# Patient Record
Sex: Female | Born: 1965 | Race: Black or African American | Hispanic: No | Marital: Single | Smoking: Never smoker
Health system: Southern US, Community
[De-identification: ages and names within clinical notes are randomized; demographics above are authoritative.]

## PROBLEM LIST (undated history)

## (undated) DIAGNOSIS — I10 Essential (primary) hypertension: Secondary | ICD-10-CM

## (undated) HISTORY — PX: APPENDECTOMY: SHX54

---

## 2005-03-26 ENCOUNTER — Emergency Department: Payer: Self-pay | Admitting: Emergency Medicine

## 2006-06-13 ENCOUNTER — Ambulatory Visit: Payer: Self-pay | Admitting: Family Medicine

## 2007-09-23 ENCOUNTER — Ambulatory Visit: Payer: Self-pay | Admitting: Family Medicine

## 2008-10-27 ENCOUNTER — Ambulatory Visit: Payer: Self-pay | Admitting: Family Medicine

## 2008-11-16 ENCOUNTER — Ambulatory Visit: Payer: Self-pay | Admitting: Family Medicine

## 2013-01-08 ENCOUNTER — Ambulatory Visit: Payer: Self-pay | Admitting: Physician Assistant

## 2014-02-12 ENCOUNTER — Ambulatory Visit: Payer: Self-pay | Admitting: Physician Assistant

## 2015-03-28 ENCOUNTER — Other Ambulatory Visit: Payer: Self-pay | Admitting: Physician Assistant

## 2015-03-28 DIAGNOSIS — Z1231 Encounter for screening mammogram for malignant neoplasm of breast: Secondary | ICD-10-CM

## 2015-04-01 ENCOUNTER — Ambulatory Visit
Admission: RE | Admit: 2015-04-01 | Discharge: 2015-04-01 | Disposition: A | Discharge status: Home / Self Care | Payer: Managed Care, Other (non HMO) | Source: Ambulatory Visit | Attending: Physician Assistant | Admitting: Physician Assistant

## 2015-04-01 DIAGNOSIS — Z1231 Encounter for screening mammogram for malignant neoplasm of breast: Secondary | ICD-10-CM | POA: Diagnosis present

## 2016-03-02 ENCOUNTER — Other Ambulatory Visit: Payer: Self-pay | Admitting: Physician Assistant

## 2016-03-02 DIAGNOSIS — Z1231 Encounter for screening mammogram for malignant neoplasm of breast: Secondary | ICD-10-CM

## 2016-04-27 ENCOUNTER — Encounter (HOSPITAL_COMMUNITY): Payer: Self-pay

## 2016-04-27 ENCOUNTER — Ambulatory Visit
Admission: RE | Admit: 2016-04-27 | Discharge: 2016-04-27 | Disposition: A | Discharge status: Home / Self Care | Payer: Managed Care, Other (non HMO) | Source: Ambulatory Visit | Attending: Physician Assistant | Admitting: Physician Assistant

## 2016-04-27 DIAGNOSIS — Z1231 Encounter for screening mammogram for malignant neoplasm of breast: Secondary | ICD-10-CM

## 2017-06-13 ENCOUNTER — Other Ambulatory Visit: Payer: Self-pay | Admitting: Physician Assistant

## 2017-06-13 DIAGNOSIS — Z1231 Encounter for screening mammogram for malignant neoplasm of breast: Secondary | ICD-10-CM

## 2017-06-17 ENCOUNTER — Ambulatory Visit
Admission: RE | Admit: 2017-06-17 | Discharge: 2017-06-17 | Disposition: A | Discharge status: Home / Self Care | Payer: Managed Care, Other (non HMO) | Source: Ambulatory Visit | Attending: Physician Assistant | Admitting: Physician Assistant

## 2017-06-17 DIAGNOSIS — Z1231 Encounter for screening mammogram for malignant neoplasm of breast: Secondary | ICD-10-CM | POA: Diagnosis present

## 2018-07-07 ENCOUNTER — Encounter: Payer: Self-pay | Admitting: Emergency Medicine

## 2018-07-07 ENCOUNTER — Other Ambulatory Visit: Payer: Self-pay

## 2018-07-07 ENCOUNTER — Emergency Department
Admission: EM | Admit: 2018-07-07 | Discharge: 2018-07-07 | Disposition: A | Discharge status: Home / Self Care | Payer: 59 | Attending: Student in an Organized Health Care Education/Training Program | Admitting: Student in an Organized Health Care Education/Training Program

## 2018-07-07 DIAGNOSIS — I10 Essential (primary) hypertension: Secondary | ICD-10-CM | POA: Diagnosis not present

## 2018-07-07 DIAGNOSIS — R002 Palpitations: Secondary | ICD-10-CM | POA: Diagnosis present

## 2018-07-07 HISTORY — DX: Essential (primary) hypertension: I10

## 2018-07-07 LAB — URINALYSIS, ROUTINE W REFLEX MICROSCOPIC
BACTERIA UA: NONE SEEN
BILIRUBIN URINE: NEGATIVE
GLUCOSE, UA: NEGATIVE mg/dL
Ketones, ur: NEGATIVE mg/dL
Leukocytes,Ua: NEGATIVE
Nitrite: NEGATIVE
PH: 7 (ref 5.0–8.0)
Protein, ur: NEGATIVE mg/dL
SPECIFIC GRAVITY, URINE: 1.002 — AB (ref 1.005–1.030)
WBC UA: NONE SEEN WBC/hpf (ref 0–5)

## 2018-07-07 LAB — CBC
HCT: 45.8 % (ref 36.0–46.0)
Hemoglobin: 14.8 g/dL (ref 12.0–15.0)
MCH: 29.8 pg (ref 26.0–34.0)
MCHC: 32.3 g/dL (ref 30.0–36.0)
MCV: 92.3 fL (ref 80.0–100.0)
Platelets: 389 10*3/uL (ref 150–400)
RBC: 4.96 MIL/uL (ref 3.87–5.11)
RDW: 12.3 % (ref 11.5–15.5)
WBC: 9.9 10*3/uL (ref 4.0–10.5)
nRBC: 0 % (ref 0.0–0.2)

## 2018-07-07 LAB — BASIC METABOLIC PANEL
Anion gap: 9 (ref 5–15)
BUN: 17 mg/dL (ref 6–20)
CHLORIDE: 102 mmol/L (ref 98–111)
CO2: 28 mmol/L (ref 22–32)
CREATININE: 1.01 mg/dL — AB (ref 0.44–1.00)
Calcium: 9.7 mg/dL (ref 8.9–10.3)
GFR calc Af Amer: >60 mL/min (ref >60)
GFR calc non Af Amer: >60 mL/min (ref >60)
Glucose, Bld: 90 mg/dL (ref 70–99)
Potassium: 3.9 mmol/L (ref 3.5–5.1)
Sodium: 139 mmol/L (ref 135–145)

## 2018-07-07 LAB — TROPONIN I
Troponin I: <0.03 ng/mL (ref <0.03)
Troponin I: <0.03 ng/mL (ref <0.03)

## 2018-07-07 MED ORDER — LORAZEPAM 0.5 MG PO TABS: 0.5000 mg | ORAL_TABLET | Freq: Three times a day (TID) | ORAL | 0 refills | Status: AC | PRN

## 2018-07-07 NOTE — ED Provider Notes (Signed)
[facility] Emergency Department Provider Note    First MD Initiated Contact with Patient 07/07/18 1730     (approximate)  I have reviewed the triage vital signs and the nursing notes.   HISTORY  Chief Complaint Palpitations    HPI Jenna Durham is a 53 y.o. female listed past medical history presents the ER for evaluation of palpitations.  States that she is been having them intermittently for the past week or so.  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  Did not feel that it was irregular.  Denies any chest pressure or pain.  Does feel she is been stressed out and under increasing pressure at work.  Denies any measured fevers.  No cough.  No shortness of breath.    Past Medical History:  Diagnosis Date  . Hypertension    Family History  Problem Relation Age of Onset  . Breast cancer Neg Hx    Past Surgical History:  Procedure Laterality Date  . APPENDECTOMY     There are no active problems to display for this patient.     Prior to Admission medications   Medication Sig Start Date End Date Taking? Authorizing Provider  LORazepam (ATIVAN) 0.5 MG tablet Take 1 tablet (0.5 mg total) by mouth every 8 (eight) hours as needed for anxiety or sleep. 07/07/18 07/07/19  Willy Eddy, MD    Allergies Patient has no known allergies.    Social History Social History   Tobacco Use  . Smoking status: Never Smoker  . Smokeless tobacco: Never Used  Substance Use Topics  . Alcohol use: Not Currently  . Drug use: Not on file    Review of Systems Patient denies headaches, rhinorrhea, blurry vision, numbness, shortness of breath, chest pain, edema, cough, abdominal pain, nausea, vomiting, diarrhea, dysuria, fevers, rashes or hallucinations unless otherwise stated above in HPI. ____________________________________________   PHYSICAL  EXAM:  VITAL SIGNS: Vitals:   07/07/18 1513 07/07/18 1519  BP: (!) 152/89   Pulse: 97   Resp: 18   Temp:  98.9 F (37.2 C)  SpO2: 98%     Constitutional: Alert and oriented.  Eyes: Conjunctivae are normal.  Head: Atraumatic. Nose: No congestion/rhinnorhea. Mouth/Throat: Mucous membranes are moist.   Neck: No stridor. Painless ROM.  Cardiovascular: Normal rate, regular rhythm. Grossly normal heart sounds.  Good peripheral circulation. Respiratory: Normal respiratory effort.  No retractions. Lungs CTAB. Gastrointestinal: Soft and nontender. No distention. No abdominal bruits. No CVA tenderness. Genitourinary:  Musculoskeletal: No lower extremity tenderness nor edema.  No joint effusions. Neurologic:  Normal speech and language. No gross focal neurologic deficits are appreciated. No facial droop Skin:  Skin is warm, dry and intact. No rash noted. Psychiatric: Mood and affect are normal. Speech and behavior are normal.  ____________________________________________   LABS (all labs ordered are listed, but only abnormal results are displayed)  Results for orders placed or performed during the hospital encounter of 07/07/18 (from the past 24 hour(s))  Basic metabolic panel     Status: Abnormal   Collection Time: 07/07/18  3:23 PM  Result Value Ref Range   Sodium 139 135 - 145 mmol/L   Potassium 3.9 3.5 - 5.1 mmol/L   Chloride 102 98 - 111 mmol/L   CO2 28 22 - 32 mmol/L   Glucose, Bld 90 70 - 99 mg/dL   BUN 17  6 - 20 mg/dL   Creatinine, Ser 8.76 (H) 0.44 - 1.00 mg/dL   Calcium 9.7 8.9 - 81.1 mg/dL   GFR calc non Af Amer >60 >60 mL/min   GFR calc Af Amer >60 >60 mL/min   Anion gap 9 5 - 15  CBC     Status: None   Collection Time: 07/07/18  3:23 PM  Result Value Ref Range   WBC 9.9 4.0 - 10.5 K/uL   RBC 4.96 3.87 - 5.11 MIL/uL   Hemoglobin 14.8 12.0 - 15.0 g/dL   HCT 57.2 62.0 - 35.5 %   MCV 92.3 80.0 - 100.0 fL   MCH 29.8 26.0 - 34.0 pg   MCHC 32.3 30.0 - 36.0 g/dL    RDW 97.4 16.3 - 84.5 %   Platelets 389 150 - 400 K/uL   nRBC 0.0 0.0 - 0.2 %  Troponin I - ONCE - STAT     Status: None   Collection Time: 07/07/18  3:23 PM  Result Value Ref Range   Troponin I <0.03 <0.03 ng/mL  Urinalysis, Routine w reflex microscopic     Status: Abnormal   Collection Time: 07/07/18  3:23 PM  Result Value Ref Range   Color, Urine COLORLESS (A) YELLOW   APPearance CLEAR (A) CLEAR   Specific Gravity, Urine 1.002 (L) 1.005 - 1.030   pH 7.0 5.0 - 8.0   Glucose, UA NEGATIVE NEGATIVE mg/dL   Hgb urine dipstick SMALL (A) NEGATIVE   Bilirubin Urine NEGATIVE NEGATIVE   Ketones, ur NEGATIVE NEGATIVE mg/dL   Protein, ur NEGATIVE NEGATIVE mg/dL   Nitrite NEGATIVE NEGATIVE   Leukocytes,Ua NEGATIVE NEGATIVE   RBC / HPF 0-5 0 - 5 RBC/hpf   WBC, UA NONE SEEN 0 - 5 WBC/hpf   Bacteria, UA NONE SEEN NONE SEEN   Squamous Epithelial / LPF 0-5 0 - 5  Troponin I - ONCE - STAT     Status: None   Collection Time: 07/07/18  6:20 PM  Result Value Ref Range   Troponin I <0.03 <0.03 ng/mL   ____________________________________________  EKG My review and personal interpretation at Time: 15:21   Indication: palpitations  Rate: 99  Rhythm: sinus Axis: normal Other: normal intervals, no stemi ____________________________________________   ____________________________________________   PROCEDURES  Procedure(s) performed:  Procedures    Critical Care performed: no ____________________________________________   INITIAL IMPRESSION / ASSESSMENT AND PLAN / ED COURSE  Pertinent labs & imaging results that were available during my care of the patient were reviewed by me and considered in my medical decision making (see chart for details).   DDX: Dysrhythmia, anemia, ACS, anxiety, electrolyte abnormality  Jenna Durham is a 53 y.o. who presents to the ED with symptoms as described above.  Patient well-appearing and hemodynamically stable.  She is without any chest pain or  pressure.  Seems to be having more symptomatic palpitations though she is not having any signs of regular heartbeat or dysrhythmia on EKG or monitor.  Blood will be sent for the above differential.  She is low risk heart score.  Will order serial enzymes to further re-stratify.  Do have a high suspicions for some component of an stress induced symptomatology.  Clinical Course as of Jul 07 2032  Mon Jul 07, 2018  2025 Repeat troponin is negative.  At this point do believe patient stable and appropriate for outpatient follow-up.   [PR]    Clinical Course User Index [PR] Willy Eddy, MD  As part of my medical decision making, I reviewed the following data within the electronic MEDICAL RECORD NUMBER Nursing notes reviewed and incorporated, Labs reviewed, notes from prior ED visits and [place] Controlled Substance Database   ____________________________________________   FINAL CLINICAL IMPRESSION(S) / ED DIAGNOSES  Final diagnoses:  Palpitations      NEW MEDICATIONS STARTED DURING THIS VISIT:  New Prescriptions   LORAZEPAM (ATIVAN) 0.5 MG TABLET    Take 1 tablet (0.5 mg total) by mouth every 8 (eight) hours as needed for anxiety or sleep.     Note:  This document was prepared using Dragon voice recognition software and may include unintentional dictation errors.    Willy Eddy, MD 07/07/18 2034

## 2018-07-07 NOTE — ED Triage Notes (Signed)
Says she was at work and kept getting hot and felt her pulse.  She does have to wear a mask and hood at work.  Denies chest pain.

## 2019-02-18 ENCOUNTER — Other Ambulatory Visit: Payer: Self-pay | Admitting: Orthopedic Surgery

## 2019-02-18 DIAGNOSIS — M25561 Pain in right knee: Secondary | ICD-10-CM

## 2019-02-18 DIAGNOSIS — M1812 Unilateral primary osteoarthritis of first carpometacarpal joint, left hand: Secondary | ICD-10-CM

## 2019-02-18 DIAGNOSIS — M1711 Unilateral primary osteoarthritis, right knee: Secondary | ICD-10-CM

## 2019-03-06 ENCOUNTER — Ambulatory Visit
Admission: RE | Admit: 2019-03-06 | Discharge: 2019-03-06 | Disposition: A | Discharge status: Home / Self Care | Payer: 59 | Source: Ambulatory Visit | Attending: Orthopedic Surgery | Admitting: Orthopedic Surgery

## 2019-03-06 ENCOUNTER — Other Ambulatory Visit: Payer: Self-pay

## 2019-03-06 DIAGNOSIS — M1711 Unilateral primary osteoarthritis, right knee: Secondary | ICD-10-CM | POA: Diagnosis present

## 2019-03-06 DIAGNOSIS — M25561 Pain in right knee: Secondary | ICD-10-CM | POA: Insufficient documentation

## 2019-03-06 DIAGNOSIS — M1812 Unilateral primary osteoarthritis of first carpometacarpal joint, left hand: Secondary | ICD-10-CM | POA: Diagnosis present

## 2020-06-02 ENCOUNTER — Other Ambulatory Visit: Payer: Self-pay | Admitting: Nurse Practitioner

## 2020-06-02 DIAGNOSIS — Z1231 Encounter for screening mammogram for malignant neoplasm of breast: Secondary | ICD-10-CM

## 2020-06-23 ENCOUNTER — Other Ambulatory Visit: Payer: Self-pay

## 2020-06-23 ENCOUNTER — Ambulatory Visit
Admission: RE | Admit: 2020-06-23 | Discharge: 2020-06-23 | Disposition: A | Discharge status: Home / Self Care | Payer: 59 | Source: Ambulatory Visit | Attending: Nurse Practitioner | Admitting: Nurse Practitioner

## 2020-06-23 DIAGNOSIS — Z1231 Encounter for screening mammogram for malignant neoplasm of breast: Secondary | ICD-10-CM | POA: Diagnosis not present

## 2020-09-29 ENCOUNTER — Other Ambulatory Visit: Payer: Self-pay | Admitting: Orthopedic Surgery

## 2020-09-29 DIAGNOSIS — M7712 Lateral epicondylitis, left elbow: Secondary | ICD-10-CM

## 2020-09-29 DIAGNOSIS — M25522 Pain in left elbow: Secondary | ICD-10-CM

## 2020-10-12 ENCOUNTER — Ambulatory Visit
Admission: RE | Admit: 2020-10-12 | Discharge: 2020-10-12 | Disposition: A | Discharge status: Home / Self Care | Payer: 59 | Source: Ambulatory Visit | Attending: Orthopedic Surgery | Admitting: Orthopedic Surgery

## 2020-10-12 ENCOUNTER — Other Ambulatory Visit: Payer: Self-pay

## 2020-10-12 DIAGNOSIS — M25522 Pain in left elbow: Secondary | ICD-10-CM | POA: Insufficient documentation

## 2020-10-12 DIAGNOSIS — M7712 Lateral epicondylitis, left elbow: Secondary | ICD-10-CM | POA: Diagnosis present

## 2021-03-10 ENCOUNTER — Other Ambulatory Visit (HOSPITAL_BASED_OUTPATIENT_CLINIC_OR_DEPARTMENT_OTHER): Payer: Self-pay | Admitting: Nurse Practitioner

## 2021-03-10 ENCOUNTER — Other Ambulatory Visit: Payer: Self-pay | Admitting: Nurse Practitioner

## 2021-03-10 DIAGNOSIS — R109 Unspecified abdominal pain: Secondary | ICD-10-CM

## 2021-03-10 DIAGNOSIS — R319 Hematuria, unspecified: Secondary | ICD-10-CM

## 2021-03-20 ENCOUNTER — Other Ambulatory Visit: Payer: Self-pay

## 2021-03-20 ENCOUNTER — Ambulatory Visit
Admission: RE | Admit: 2021-03-20 | Discharge: 2021-03-20 | Disposition: A | Discharge status: Home / Self Care | Payer: 59 | Source: Ambulatory Visit | Attending: Nurse Practitioner | Admitting: Nurse Practitioner

## 2021-03-20 DIAGNOSIS — R109 Unspecified abdominal pain: Secondary | ICD-10-CM | POA: Diagnosis present

## 2021-03-20 DIAGNOSIS — R319 Hematuria, unspecified: Secondary | ICD-10-CM | POA: Insufficient documentation

## 2021-06-13 ENCOUNTER — Other Ambulatory Visit: Payer: Self-pay | Admitting: Nurse Practitioner

## 2021-06-13 DIAGNOSIS — Z1231 Encounter for screening mammogram for malignant neoplasm of breast: Secondary | ICD-10-CM

## 2021-07-25 ENCOUNTER — Other Ambulatory Visit: Payer: Self-pay

## 2021-07-25 ENCOUNTER — Ambulatory Visit
Admission: RE | Admit: 2021-07-25 | Discharge: 2021-07-25 | Disposition: A | Discharge status: Home / Self Care | Payer: 59 | Source: Ambulatory Visit | Attending: Nurse Practitioner | Admitting: Nurse Practitioner

## 2021-07-25 DIAGNOSIS — Z1231 Encounter for screening mammogram for malignant neoplasm of breast: Secondary | ICD-10-CM | POA: Diagnosis present

## 2022-06-11 ENCOUNTER — Other Ambulatory Visit: Payer: Self-pay | Admitting: Nurse Practitioner

## 2022-06-12 LAB — COMPREHENSIVE METABOLIC PANEL
ALT: 20 IU/L (ref 0–32)
AST: 20 IU/L (ref 0–40)
Albumin/Globulin Ratio: 1.6 (ref 1.2–2.2)
Albumin: 4.2 g/dL (ref 3.8–4.9)
Alkaline Phosphatase: 77 IU/L (ref 44–121)
BUN/Creatinine Ratio: 12 (ref 9–23)
BUN: 10 mg/dL (ref 6–24)
Bilirubin Total: 0.5 mg/dL (ref 0.0–1.2)
CO2: 24 mmol/L (ref 20–29)
Calcium: 9.1 mg/dL (ref 8.7–10.2)
Chloride: 101 mmol/L (ref 96–106)
Creatinine, Ser: 0.84 mg/dL (ref 0.57–1.00)
Globulin, Total: 2.6 g/dL (ref 1.5–4.5)
Glucose: 84 mg/dL (ref 70–99)
Potassium: 4.4 mmol/L (ref 3.5–5.2)
Sodium: 140 mmol/L (ref 134–144)
Total Protein: 6.8 g/dL (ref 6.0–8.5)
eGFR: 81 mL/min/{1.73_m2} (ref >59)

## 2022-06-12 LAB — LIPID PANEL W/O CHOL/HDL RATIO
Cholesterol, Total: 135 mg/dL (ref 100–199)
HDL: 70 mg/dL (ref >39)
LDL Chol Calc (NIH): 54 mg/dL (ref 0–99)
Triglycerides: 49 mg/dL (ref 0–149)
VLDL Cholesterol Cal: 11 mg/dL (ref 5–40)

## 2022-06-12 LAB — TSH: TSH: 2.23 u[IU]/mL (ref 0.450–4.500)

## 2022-06-14 ENCOUNTER — Encounter: Payer: Self-pay | Admitting: Nurse Practitioner

## 2022-06-14 ENCOUNTER — Ambulatory Visit (INDEPENDENT_AMBULATORY_CARE_PROVIDER_SITE_OTHER): Payer: Managed Care, Other (non HMO) | Admitting: Nurse Practitioner

## 2022-06-14 VITALS — BP 130/70 | HR 76 | Wt 188.0 lb

## 2022-06-14 DIAGNOSIS — M25531 Pain in right wrist: Secondary | ICD-10-CM | POA: Diagnosis not present

## 2022-06-14 DIAGNOSIS — I10 Essential (primary) hypertension: Secondary | ICD-10-CM | POA: Insufficient documentation

## 2022-06-14 HISTORY — DX: Pain in right wrist: M25.531

## 2022-06-14 NOTE — Progress Notes (Signed)
Patient ID: Jenna Durham, Sex: female DOB: 12-25-1965, 57 y.o..   MRN: [id number]   No chief complaint on file.    6 month follow up and lab results review     Review of Systems  Constitutional: Negative.   HENT: Negative.    Eyes: Negative.   Respiratory: Negative.    Cardiovascular: Negative.   Gastrointestinal: Negative.   Genitourinary: Negative.   Musculoskeletal:  Positive for myalgias.  Skin: Negative.   Neurological: Negative.   Endo/Heme/Allergies: Negative.   Psychiatric/Behavioral: Negative.       Physical Exam Constitutional:      Appearance: Normal appearance.  HENT:     Head: Normocephalic.     Nose: Nose normal.     Mouth/Throat:     Mouth: Mucous membranes are moist.  Eyes:     Pupils: Pupils are equal, round, and reactive to light.  Cardiovascular:     Rate and Rhythm: Normal rate and regular rhythm.  Pulmonary:     Breath sounds: Normal breath sounds.  Abdominal:     Palpations: Abdomen is soft.  Musculoskeletal:        General: Tenderness present.     Cervical back: Neck supple.  Skin:    General: Skin is warm and dry.  Neurological:     Mental Status: She is alert and oriented to person, place, and time.       Problem List Items Addressed This Visit       Cardiovascular and Mediastinum   Essential hypertension, benign - Primary     Other   Right wrist pain      Evern Bio, NP

## 2022-06-14 NOTE — Patient Instructions (Signed)
Follow up appt in 6 months, fasting labs prior

## 2022-06-21 ENCOUNTER — Telehealth: Payer: Self-pay

## 2022-06-21 ENCOUNTER — Encounter: Payer: Self-pay | Admitting: Nurse Practitioner

## 2022-06-21 DIAGNOSIS — J069 Acute upper respiratory infection, unspecified: Secondary | ICD-10-CM

## 2022-06-21 MED ORDER — AZITHROMYCIN 250 MG PO TABS: ORAL_TABLET | ORAL | 0 refills | Status: AC

## 2022-06-21 NOTE — Telephone Encounter (Signed)
I'll send in the antibiotic and do the work note

## 2022-06-21 NOTE — Telephone Encounter (Signed)
Patient called stating that she went to urgent care earlier this week, she did a covid flu test and it was negative they didn't give her an abx they told her to get hot tea with honey, she is requesting to get an abx or something called in for cough and is asking for a work note to be out from now until Monday feb 12th

## 2022-06-27 ENCOUNTER — Encounter: Payer: Self-pay | Admitting: Nurse Practitioner

## 2022-06-27 ENCOUNTER — Ambulatory Visit (INDEPENDENT_AMBULATORY_CARE_PROVIDER_SITE_OTHER): Payer: Managed Care, Other (non HMO) | Admitting: Nurse Practitioner

## 2022-06-27 VITALS — BP 132/82 | HR 77 | Ht 69.0 in | Wt 185.0 lb

## 2022-06-27 DIAGNOSIS — I10 Essential (primary) hypertension: Secondary | ICD-10-CM

## 2022-06-27 DIAGNOSIS — J301 Allergic rhinitis due to pollen: Secondary | ICD-10-CM | POA: Diagnosis not present

## 2022-06-27 DIAGNOSIS — J069 Acute upper respiratory infection, unspecified: Secondary | ICD-10-CM | POA: Diagnosis not present

## 2022-06-27 HISTORY — DX: Allergic rhinitis due to pollen: J30.1

## 2022-06-27 HISTORY — DX: Acute upper respiratory infection, unspecified: J06.9

## 2022-06-27 MED ORDER — BENZONATATE 100 MG PO CAPS: 100.0000 mg | ORAL_CAPSULE | Freq: Three times a day (TID) | ORAL | 1 refills | Status: DC | PRN

## 2022-06-27 MED ORDER — PREDNISONE 50 MG PO TABS: ORAL_TABLET | ORAL | 0 refills | Status: DC

## 2022-06-27 MED ORDER — AZITHROMYCIN 500 MG PO TABS: 500.0000 mg | ORAL_TABLET | Freq: Every day | ORAL | 0 refills | Status: AC

## 2022-06-27 NOTE — Progress Notes (Signed)
Established Patient Office Visit  Subjective:  Patient ID: Jenna Durham, female    DOB: 08/28/1965  Age: 57 y.o. MRN: [id number]  No chief complaint on file.   Acute visit, continues to have coughing.     Past Medical History:  Diagnosis Date   Hypertension     Social History   Socioeconomic History   Marital status: Single    Spouse name: Not on file   Number of children: Not on file   Years of education: Not on file   Highest education level: Not on file  Occupational History   Not on file  Tobacco Use   Smoking status: Never   Smokeless tobacco: Never  Substance and Sexual Activity   Alcohol use: Not Currently   Drug use: Not on file   Sexual activity: Not on file  Other Topics Concern   Not on file  Social History Narrative   Not on file   Social Determinants of Health   Financial Resource Strain: Not on file  Food Insecurity: Not on file  Transportation Needs: Not on file  Physical Activity: Not on file  Stress: Not on file  Social Connections: Not on file  Intimate Partner Violence: Not on file    Family History  Problem Relation Age of Onset   Breast cancer Neg Hx     No Known Allergies  Review of Systems  Constitutional:  Positive for malaise/fatigue.  HENT:  Positive for congestion.   Eyes: Negative.   Respiratory:  Positive for cough.   Cardiovascular: Negative.   Gastrointestinal: Negative.   Genitourinary: Negative.   Musculoskeletal: Negative.   Skin: Negative.   Neurological: Negative.   Endo/Heme/Allergies: Negative.   Psychiatric/Behavioral: Negative.         Objective:   LMP 03/25/2015   There were no vitals filed for this visit.  Physical Exam Constitutional:      Appearance: Normal appearance.  HENT:     Head: Normocephalic.     Nose: Congestion present.     Mouth/Throat:     Mouth: Mucous membranes are dry.  Eyes:     Pupils: Pupils are equal, round, and reactive to light.  Cardiovascular:     Rate  and Rhythm: Normal rate and regular rhythm.  Pulmonary:     Effort: Pulmonary effort is normal.     Breath sounds: Normal breath sounds.  Abdominal:     General: Bowel sounds are normal.     Palpations: Abdomen is soft.  Musculoskeletal:        General: Normal range of motion.     Cervical back: Neck supple.  Skin:    General: Skin is warm and dry.  Neurological:     Mental Status: She is alert and oriented to person, place, and time.  Psychiatric:        Mood and Affect: Mood normal.        Behavior: Behavior normal.      No results found for any visits on 06/27/22.  Recent Results (from the past 2160 hour(s))  Comprehensive metabolic panel     Status: None   Collection Time: 06/11/22  9:00 AM  Result Value Ref Range   Glucose 84 70 - 99 mg/dL   BUN 10 6 - 24 mg/dL   Creatinine, Ser 0.84 0.57 - 1.00 mg/dL   eGFR 81 >59 mL/min/1.73   BUN/Creatinine Ratio 12 9 - 23   Sodium 140 134 - 144 mmol/L   Potassium 4.4 3.5 -  5.2 mmol/L   Chloride 101 96 - 106 mmol/L   CO2 24 20 - 29 mmol/L   Calcium 9.1 8.7 - 10.2 mg/dL   Total Protein 6.8 6.0 - 8.5 g/dL   Albumin 4.2 3.8 - 4.9 g/dL   Globulin, Total 2.6 1.5 - 4.5 g/dL   Albumin/Globulin Ratio 1.6 1.2 - 2.2   Bilirubin Total 0.5 0.0 - 1.2 mg/dL   Alkaline Phosphatase 77 44 - 121 IU/L   AST 20 0 - 40 IU/L   ALT 20 0 - 32 IU/L  Lipid Panel w/o Chol/HDL Ratio     Status: None   Collection Time: 06/11/22  9:00 AM  Result Value Ref Range   Cholesterol, Total 135 100 - 199 mg/dL   Triglycerides 49 0 - 149 mg/dL   HDL 70 >39 mg/dL   VLDL Cholesterol Cal 11 5 - 40 mg/dL   LDL Chol Calc (NIH) 54 0 - 99 mg/dL  TSH     Status: None   Collection Time: 06/11/22  9:00 AM  Result Value Ref Range   TSH 2.230 0.450 - 4.500 uIU/mL      Assessment & Plan:   Problem List Items Addressed This Visit       Cardiovascular and Mediastinum   Essential hypertension, benign - Primary     Respiratory   URI with cough and congestion    Relevant Orders   DG Chest 2 View   Seasonal allergic rhinitis due to pollen    Return in about 1 week (around 07/04/2022) for follow up URI.   Total time spent: 30 minutes  Evern Bio, NP  06/27/2022

## 2022-06-27 NOTE — Patient Instructions (Signed)
1) CXR, will call with results when available 2) Antibiotic, cough suppressant 3) Follow up appt in 1 week

## 2022-06-28 ENCOUNTER — Ambulatory Visit: Payer: Managed Care, Other (non HMO)

## 2022-06-28 DIAGNOSIS — J069 Acute upper respiratory infection, unspecified: Secondary | ICD-10-CM

## 2022-06-29 ENCOUNTER — Ambulatory Visit: Payer: Managed Care, Other (non HMO) | Admitting: Cardiovascular Disease

## 2022-06-29 ENCOUNTER — Ambulatory Visit (INDEPENDENT_AMBULATORY_CARE_PROVIDER_SITE_OTHER): Payer: Managed Care, Other (non HMO) | Admitting: Cardiovascular Disease

## 2022-06-29 ENCOUNTER — Encounter: Payer: Self-pay | Admitting: Cardiovascular Disease

## 2022-06-29 VITALS — BP 128/80 | HR 90 | Ht 69.0 in | Wt 191.0 lb

## 2022-06-29 DIAGNOSIS — R0789 Other chest pain: Secondary | ICD-10-CM | POA: Diagnosis not present

## 2022-06-29 DIAGNOSIS — R0602 Shortness of breath: Secondary | ICD-10-CM

## 2022-06-29 NOTE — Progress Notes (Unsigned)
Cardiology Office Note   Date:  06/29/2022   ID:  Jenna Durham, DOB 1965/06/15, MRN [id number]  PCP:  Evern Bio, NP  Cardiologist:  Neoma Laming, MD      History of Present Illness: Jenna Durham is a 57 y.o. female who presents for  Chief Complaint  Patient presents with   Follow-up    4 month fu    Shortness of Breath This is a new problem. The current episode started in the past 7 days. The problem occurs daily. The problem has been gradually improving. Associated symptoms include a fever, headaches and sputum production. The treatment provided significant relief. Her past medical history is significant for bronchiolitis.      Past Medical History:  Diagnosis Date   Hypertension      Past Surgical History:  Procedure Laterality Date   APPENDECTOMY       Current Outpatient Medications  Medication Sig Dispense Refill   azithromycin (ZITHROMAX) 500 MG tablet Take 1 tablet (500 mg total) by mouth daily for 7 days. Take 1 tablet daily for 3 days. 7 tablet 0   benzonatate (TESSALON PERLES) 100 MG capsule Take 1 capsule (100 mg total) by mouth 3 (three) times daily as needed for cough. 30 capsule 1   losartan (COZAAR) 100 MG tablet Take 100 mg by mouth daily.     pantoprazole (PROTONIX) 40 MG tablet Take 40 mg by mouth daily.     predniSONE (DELTASONE) 50 MG tablet Take 1 tablet by mouth daily in AM x 5 days with food 5 tablet 0   rosuvastatin (CRESTOR) 20 MG tablet Take 20 mg by mouth at bedtime.     No current facility-administered medications for this visit.    Allergies:   Amlodipine besylate    Social History:   reports that she has never smoked. She has never used smokeless tobacco. She reports that she does not currently use alcohol. No history on file for drug use.   Family History:  family history is not on file.    ROS:     Review of Systems  Constitutional:  Positive for fever.  HENT: Negative.    Eyes: Negative.   Respiratory:   Positive for sputum production and shortness of breath.   Gastrointestinal: Negative.   Genitourinary: Negative.   Musculoskeletal: Negative.   Skin: Negative.   Neurological:  Positive for headaches.  Endo/Heme/Allergies: Negative.   Psychiatric/Behavioral: Negative.    All other systems reviewed and are negative.     All other systems are reviewed and negative.    PHYSICAL EXAM: VS:  BP 128/80   Pulse 90   Ht 5' 9"$  (1.753 m)   Wt 191 lb (86.6 kg)   LMP 03/25/2015   SpO2 96%   BMI 28.21 kg/m  , BMI Body mass index is 28.21 kg/m. Last weight:  Wt Readings from Last 3 Encounters:  06/29/22 191 lb (86.6 kg)  06/27/22 185 lb (83.9 kg)  06/14/22 188 lb (85.3 kg)     Physical Exam Constitutional:      Appearance: Normal appearance.  Cardiovascular:     Rate and Rhythm: Normal rate and regular rhythm.     Heart sounds: Normal heart sounds.  Pulmonary:     Effort: Pulmonary effort is normal.     Breath sounds: Normal breath sounds.  Musculoskeletal:     Right lower leg: No edema.     Left lower leg: No edema.  Neurological:  Mental Status: She is alert.       EKG:   Recent Labs: 06/11/2022: ALT 20; BUN 10; Creatinine, Ser 0.84; Potassium 4.4; Sodium 140; TSH 2.230    Lipid Panel    Component Value Date/Time   CHOL 135 06/11/2022 0900   TRIG 49 06/11/2022 0900   HDL 70 06/11/2022 0900   LDLCALC 54 06/11/2022 0900      REASON FOR VISIT  Visit for: Echocardiogram/Shortness of breath  Sex:     female   wt=   171 lbs.  BP=160/82  Height=  70  inches.        TESTS  Imaging: Echocardiogram:  An echocardiogram in (2-d) mode was performed and in Doppler mode with color flow velocity mapping was performed. The aortic valve cusps are abnormal 2  cm, flow velocity 1.4  m/s, and systolic calculated mean flow gradient 4   mmHg. Mitral valve diastolic peak flow velocity E 0.7     m/s and E/A ratio 0.6. Aortic root diameter 3.3 cm. The LVOT internal  diameter 2.1   cm and flow velocity was abnormal 0.9 m/s. LV systolic dimension 4.1 cm, diastolic 5.3  cm, posterior wall thickness .9    cm, fractional shortening 22  %, and EF 62 %. IVS thickness 1.1 cm. LA dimension 3.8cm  RIGHT atrium=  18  cm2. Mitral Valve =  Ea=6  DT= 261 msec. Tricuspid Valve =  TR jet V=   2.2   RAP=5  RVSP=  25   mmHg. Pulmonic Valve= PIEDV=   1.1   m/s. Mitral Valve has Mild Regurgitation. Aortic Valve has Mild Regurgitation. Pulmonic Valve has Mild Regurgitation. Tricuspid Valve has Mild Regurgitation.     ASSESSMENT  Technically adequate study.  Ejection fraction-62  Left Ventricle- Mildly dilated, normal function  Left Ventricle diastolic dysfunction grade-Grade 1 Relaxation abnormality  Right Ventricle- Mild dilation, normal function  No wall motion abnormalities  Left Atrium-Mild dilation  Right Atrium-Mild dilation  Aortic valve- Trivial calcification with No stenosis and mild regurgitation  Pulmonic Valve-No stenosis Mild regurgitation  Mitral Valve-Mild regurgitation  Tricuspid Valve-Mild Regurgitation  Trivial Pericardial effusion.     THERAPY   Referring physician: Dionisio David  Sonographer: Ruta Hinds.      Neoma Laming MD  Electronically signed by: Neoma Laming     Date: 08/22/2020 08:46 REASON FOR VISIT  Referred by Neoma Laming.        TESTS  Imaging: Computed Tomographic Angiography:  Cardiac multidetector CT was performed paying particular attention to the coronary arteries for the diagnosis of: Severe chest pain. Diagnostic Drugs:  Administered iohexol (Omnipaque) through an antecubital vein and images from the examination were analyzed for the presence and extent of coronary artery disease, using 3D image processing software. 100 mL of non-ionic contrast (Omnipaque) was used.        TEST CONCLUSIONS  1 - Calcium score is 0.  2 - Right dominant system.  3 - Normal coronaries.     Neoma Laming MD   Electronically signed by: Neoma Laming     Date: 02/08/2015 09:13 Other studies Reviewed: Additional studies/ records that were reviewed today include:  Review of the above records demonstrates:       No data to display            ASSESSMENT AND PLAN:    ICD-10-CM   1. Chest pain, non-cardiac  R07.89     2. Other chest pain  R07.89  CXR was normal and had probably bronchitis as got better with zithromax.    3. SOB (shortness of breath)  R06.02 PCV ECHOCARDIOGRAM COMPLETE       Problem List Items Addressed This Visit   None Visit Diagnoses     Chest pain, non-cardiac    -  Primary   Other chest pain       CXR was normal and had probably bronchitis as got better with zithromax.   SOB (shortness of breath)       Relevant Orders   PCV ECHOCARDIOGRAM COMPLETE          Disposition:   Return in about 4 weeks (around 07/27/2022) for after echo.    Total time spent  Signed,  Neoma Laming, MD  06/29/2022 2:48 [place]

## 2022-07-05 ENCOUNTER — Ambulatory Visit: Payer: Managed Care, Other (non HMO) | Admitting: Nurse Practitioner

## 2022-07-05 VITALS — BP 122/74 | HR 75 | Ht 69.0 in | Wt 184.0 lb

## 2022-07-05 DIAGNOSIS — R5383 Other fatigue: Secondary | ICD-10-CM | POA: Insufficient documentation

## 2022-07-05 DIAGNOSIS — J069 Acute upper respiratory infection, unspecified: Secondary | ICD-10-CM | POA: Diagnosis not present

## 2022-07-05 DIAGNOSIS — J301 Allergic rhinitis due to pollen: Secondary | ICD-10-CM

## 2022-07-05 DIAGNOSIS — I10 Essential (primary) hypertension: Secondary | ICD-10-CM

## 2022-07-05 DIAGNOSIS — Z1231 Encounter for screening mammogram for malignant neoplasm of breast: Secondary | ICD-10-CM

## 2022-07-05 HISTORY — DX: Other fatigue: R53.83

## 2022-07-05 NOTE — Progress Notes (Signed)
Established Patient Office Visit  Subjective:  Patient ID: Jenna Durham, female    DOB: 09/03/1965  Age: 57 y.o. MRN: [id number]  Chief Complaint  Patient presents with   Follow-up    1 Week URI Follow Up    Pt here today for 1 week follow up.  No longer sluggish, cough has calmed down but is triggered with strong scents.  She had intolerable side effect to tessalon perles.  For lingering cough, will give patient samples of Mucinex DM.       Past Medical History:  Diagnosis Date   Hypertension     Social History   Socioeconomic History   Marital status: Single    Spouse name: Not on file   Number of children: Not on file   Years of education: Not on file   Highest education level: Not on file  Occupational History   Not on file  Tobacco Use   Smoking status: Never   Smokeless tobacco: Never  Substance and Sexual Activity   Alcohol use: Not Currently   Drug use: Not on file   Sexual activity: Not on file  Other Topics Concern   Not on file  Social History Narrative   Not on file   Social Determinants of Health   Financial Resource Strain: Not on file  Food Insecurity: Not on file  Transportation Needs: Not on file  Physical Activity: Not on file  Stress: Not on file  Social Connections: Not on file  Intimate Partner Violence: Not on file    Family History  Problem Relation Age of Onset   Breast cancer Neg Hx     Allergies  Allergen Reactions   Amlodipine Besylate     Head aches     Review of Systems  Constitutional: Negative.   HENT:  Positive for congestion.   Eyes: Negative.   Respiratory:  Positive for cough.   Cardiovascular: Negative.   Gastrointestinal: Negative.   Genitourinary: Negative.   Musculoskeletal: Negative.   Skin: Negative.   Neurological: Negative.   Endo/Heme/Allergies: Negative.   Psychiatric/Behavioral: Negative.         Objective:   BP 122/74   Pulse 75   Ht 5' 9"$  (1.753 m)   Wt 184 lb (83.5 kg)    LMP 03/25/2015   SpO2 96%   BMI 27.17 kg/m   Vitals:   07/05/22 1534  BP: 122/74  Pulse: 75  Height: 5' 9"$  (1.753 m)  Weight: 184 lb (83.5 kg)  SpO2: 96%  BMI (Calculated): 27.16    Physical Exam Vitals reviewed.  Constitutional:      Appearance: Normal appearance.  HENT:     Head: Normocephalic.     Nose: Nose normal.     Mouth/Throat:     Mouth: Mucous membranes are moist.  Eyes:     Pupils: Pupils are equal, round, and reactive to light.  Cardiovascular:     Rate and Rhythm: Normal rate and regular rhythm.  Pulmonary:     Effort: Pulmonary effort is normal.     Breath sounds: Normal breath sounds.  Abdominal:     General: Bowel sounds are normal.     Palpations: Abdomen is soft.  Musculoskeletal:        General: Normal range of motion.     Cervical back: Normal range of motion and neck supple.  Skin:    General: Skin is warm and dry.  Neurological:     Mental Status: She is alert and  oriented to person, place, and time.  Psychiatric:        Mood and Affect: Mood normal.        Behavior: Behavior normal.      No results found for any visits on 07/05/22.  Recent Results (from the past 2160 hour(s))  Comprehensive metabolic panel     Status: None   Collection Time: 06/11/22  9:00 AM  Result Value Ref Range   Glucose 84 70 - 99 mg/dL   BUN 10 6 - 24 mg/dL   Creatinine, Ser 0.84 0.57 - 1.00 mg/dL   eGFR 81 >59 mL/min/1.73   BUN/Creatinine Ratio 12 9 - 23   Sodium 140 134 - 144 mmol/L   Potassium 4.4 3.5 - 5.2 mmol/L   Chloride 101 96 - 106 mmol/L   CO2 24 20 - 29 mmol/L   Calcium 9.1 8.7 - 10.2 mg/dL   Total Protein 6.8 6.0 - 8.5 g/dL   Albumin 4.2 3.8 - 4.9 g/dL   Globulin, Total 2.6 1.5 - 4.5 g/dL   Albumin/Globulin Ratio 1.6 1.2 - 2.2   Bilirubin Total 0.5 0.0 - 1.2 mg/dL   Alkaline Phosphatase 77 44 - 121 IU/L   AST 20 0 - 40 IU/L   ALT 20 0 - 32 IU/L  Lipid Panel w/o Chol/HDL Ratio     Status: None   Collection Time: 06/11/22  9:00 AM   Result Value Ref Range   Cholesterol, Total 135 100 - 199 mg/dL   Triglycerides 49 0 - 149 mg/dL   HDL 70 >39 mg/dL   VLDL Cholesterol Cal 11 5 - 40 mg/dL   LDL Chol Calc (NIH) 54 0 - 99 mg/dL  TSH     Status: None   Collection Time: 06/11/22  9:00 AM  Result Value Ref Range   TSH 2.230 0.450 - 4.500 uIU/mL      Assessment & Plan:   Problem List Items Addressed This Visit       Cardiovascular and Mediastinum   Essential hypertension, benign     Respiratory   URI with cough and congestion - Primary   Seasonal allergic rhinitis due to pollen     Other   Other fatigue    Return in about 6 months (around 01/03/2023).   Total time spent: 30 minutes  Evern Bio, NP  07/05/2022

## 2022-07-05 NOTE — Patient Instructions (Signed)
1) Schedule mammo 2) Follow up appt in 6 months, fasting labs prior 3) Mucinex DM samples for continued cough, also consider Delsym OTC

## 2022-07-11 ENCOUNTER — Other Ambulatory Visit: Payer: Self-pay | Admitting: Cardiovascular Disease

## 2022-07-11 DIAGNOSIS — I10 Essential (primary) hypertension: Secondary | ICD-10-CM

## 2022-07-20 ENCOUNTER — Ambulatory Visit (INDEPENDENT_AMBULATORY_CARE_PROVIDER_SITE_OTHER): Payer: Managed Care, Other (non HMO)

## 2022-07-20 DIAGNOSIS — R0602 Shortness of breath: Secondary | ICD-10-CM

## 2022-07-20 DIAGNOSIS — I361 Nonrheumatic tricuspid (valve) insufficiency: Secondary | ICD-10-CM | POA: Diagnosis not present

## 2022-07-20 DIAGNOSIS — I351 Nonrheumatic aortic (valve) insufficiency: Secondary | ICD-10-CM

## 2022-07-27 ENCOUNTER — Encounter: Payer: Self-pay | Admitting: Cardiovascular Disease

## 2022-07-27 ENCOUNTER — Ambulatory Visit: Payer: Managed Care, Other (non HMO) | Admitting: Cardiovascular Disease

## 2022-07-27 VITALS — BP 108/78 | HR 104 | Ht 69.0 in | Wt 189.0 lb

## 2022-07-27 DIAGNOSIS — I10 Essential (primary) hypertension: Secondary | ICD-10-CM | POA: Diagnosis not present

## 2022-07-27 NOTE — Progress Notes (Signed)
Cardiology Office Note   Date:  07/27/2022   ID:  Jenna Durham, DOB May 20, 1965, MRN [id number]  PCP:  Jenna Bio, NP  Cardiologist:  Neoma Laming, MD      History of Present Illness: Jenna Durham is a 57 y.o. female who presents for  Chief Complaint  Patient presents with   Follow-up    4 week follow up, Echo results.    Patient in office to discuss recent echo results. Denies chest pain, shortness of breath.     Past Medical History:  Diagnosis Date   Hypertension      Past Surgical History:  Procedure Laterality Date   APPENDECTOMY       Current Outpatient Medications  Medication Sig Dispense Refill   losartan (COZAAR) 100 MG tablet TAKE 1 TABLET BY MOUTH EVERY DAY 30 tablet 2   rosuvastatin (CRESTOR) 20 MG tablet Take 20 mg by mouth at bedtime.     No current facility-administered medications for this visit.    Allergies:   Amlodipine besylate    Social History:   reports that she has never smoked. She has never used smokeless tobacco. She reports that she does not currently use alcohol. No history on file for drug use.   Family History:  family history is not on file.    ROS:     Review of Systems  Constitutional: Negative.   HENT: Negative.    Eyes: Negative.   Respiratory: Negative.    Cardiovascular: Negative.   Gastrointestinal: Negative.   Genitourinary: Negative.   Musculoskeletal: Negative.   Skin: Negative.   Neurological: Negative.   Endo/Heme/Allergies: Negative.   Psychiatric/Behavioral: Negative.    All other systems reviewed and are negative.   All other systems are reviewed and negative.   PHYSICAL EXAM: VS:  BP 108/78   Pulse (!) 104   Ht 5\' 9"  (1.753 m)   Wt 189 lb (85.7 kg)   LMP 03/25/2015   SpO2 94%   BMI 27.91 kg/m  , BMI Body mass index is 27.91 kg/m. Last weight:  Wt Readings from Last 3 Encounters:  07/27/22 189 lb (85.7 kg)  07/05/22 184 lb (83.5 kg)  06/29/22 191 lb (86.6 kg)    Physical Exam Constitutional:      Appearance: Normal appearance.  Cardiovascular:     Rate and Rhythm: Normal rate and regular rhythm.     Heart sounds: Normal heart sounds.  Pulmonary:     Effort: Pulmonary effort is normal.     Breath sounds: Normal breath sounds.  Musculoskeletal:     Right lower leg: No edema.     Left lower leg: No edema.  Neurological:     Mental Status: She is alert.     EKG: none today  Recent Labs: 06/11/2022: ALT 20; BUN 10; Creatinine, Ser 0.84; Potassium 4.4; Sodium 140; TSH 2.230    Lipid Panel    Component Value Date/Time   CHOL 135 06/11/2022 0900   TRIG 49 06/11/2022 0900   HDL 70 06/11/2022 0900   LDLCALC 54 06/11/2022 0900    Other studies Reviewed: echocardiogram  ASSESSMENT AND PLAN:    ICD-10-CM   1. Essential hypertension, benign  I10        Problem List Items Addressed This Visit       Cardiovascular and Mediastinum   Essential hypertension, benign - Primary    Patient doing well. Normal EF on echo. B/p well controlled.  Disposition:   Return in about 6 months (around 01/27/2023).    Total time spent: 30 minutes  Signed,  Neoma Laming, MD  07/27/2022 1:51 [place]

## 2022-07-27 NOTE — Assessment & Plan Note (Signed)
Patient doing well. Normal EF on echo. B/p well controlled.

## 2022-07-28 IMAGING — MG MM DIGITAL SCREENING BILAT W/ TOMO AND CAD
6 of 10 series · 6 of 30 positions shown · non-contrast
Comparison: Previous exam(s).

CLINICAL DATA: Screening.

EXAM:
DIGITAL SCREENING BILATERAL MAMMOGRAM WITH TOMOSYNTHESIS AND CAD
TECHNIQUE: Bilateral screening digital craniocaudal and mediolateral oblique
mammograms were obtained. Bilateral screening digital breast
tomosynthesis was performed. The images were evaluated with
computer-aided detection.

[L MLO synth-2D]
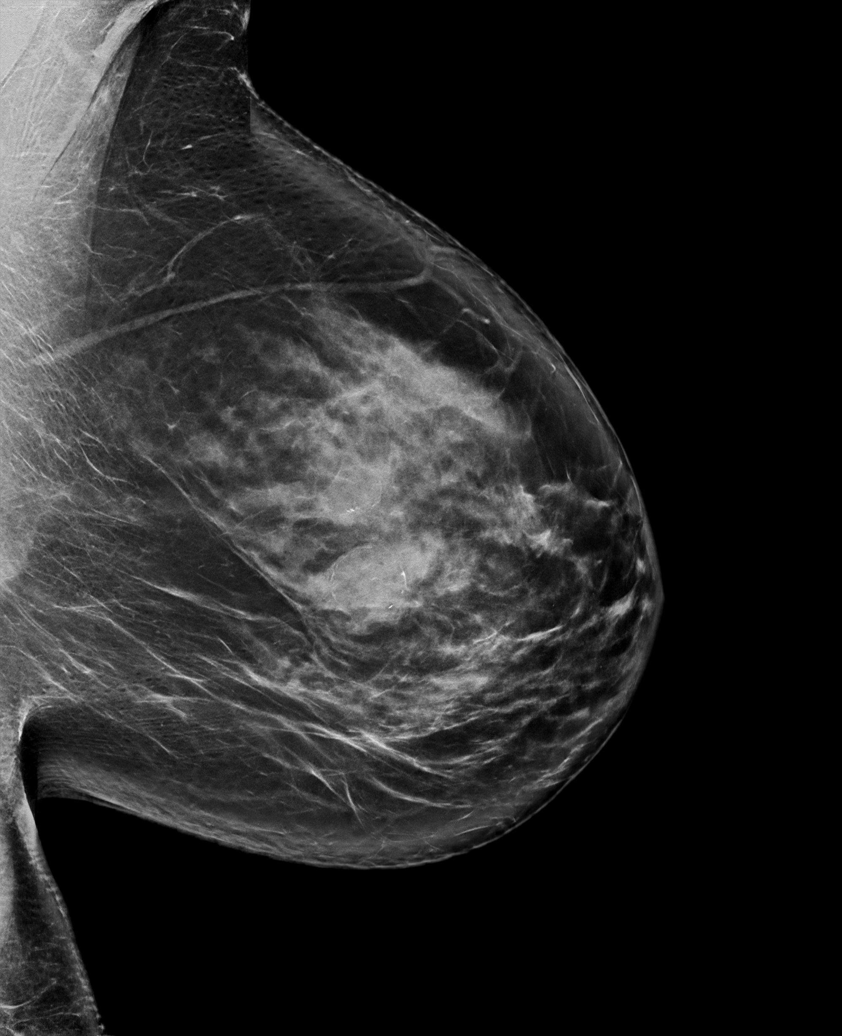

[L CC synth-2D]
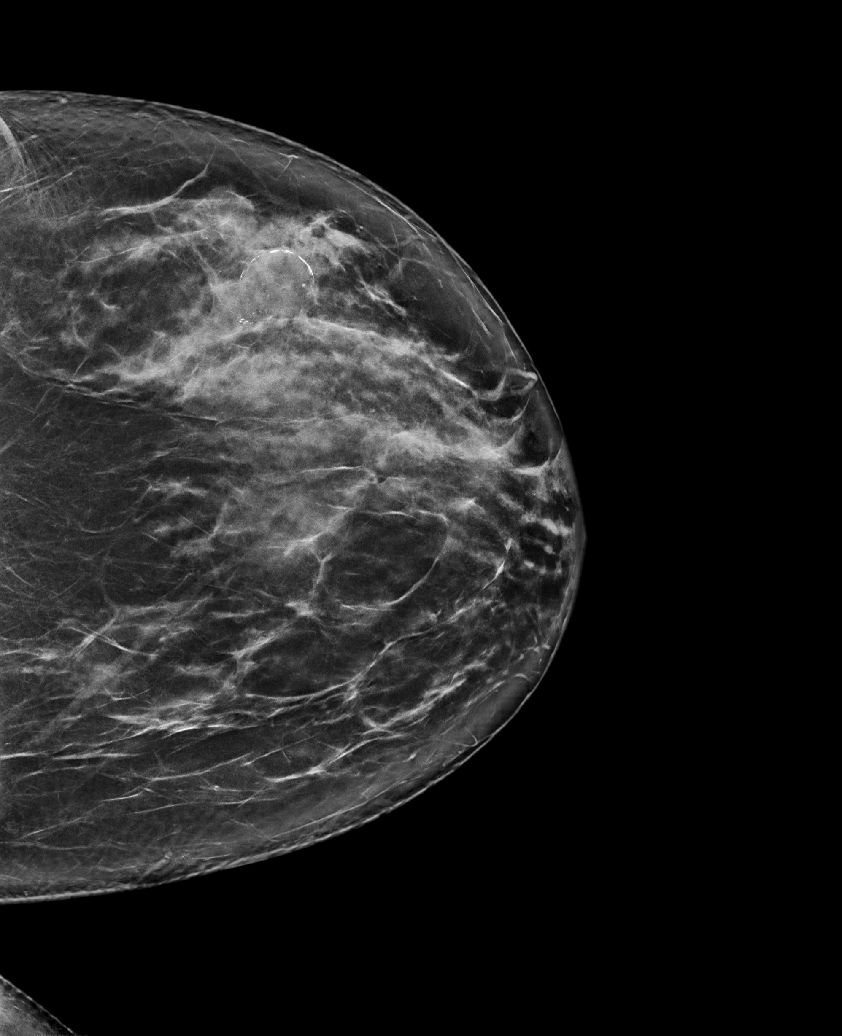

[R CC synth-2D]
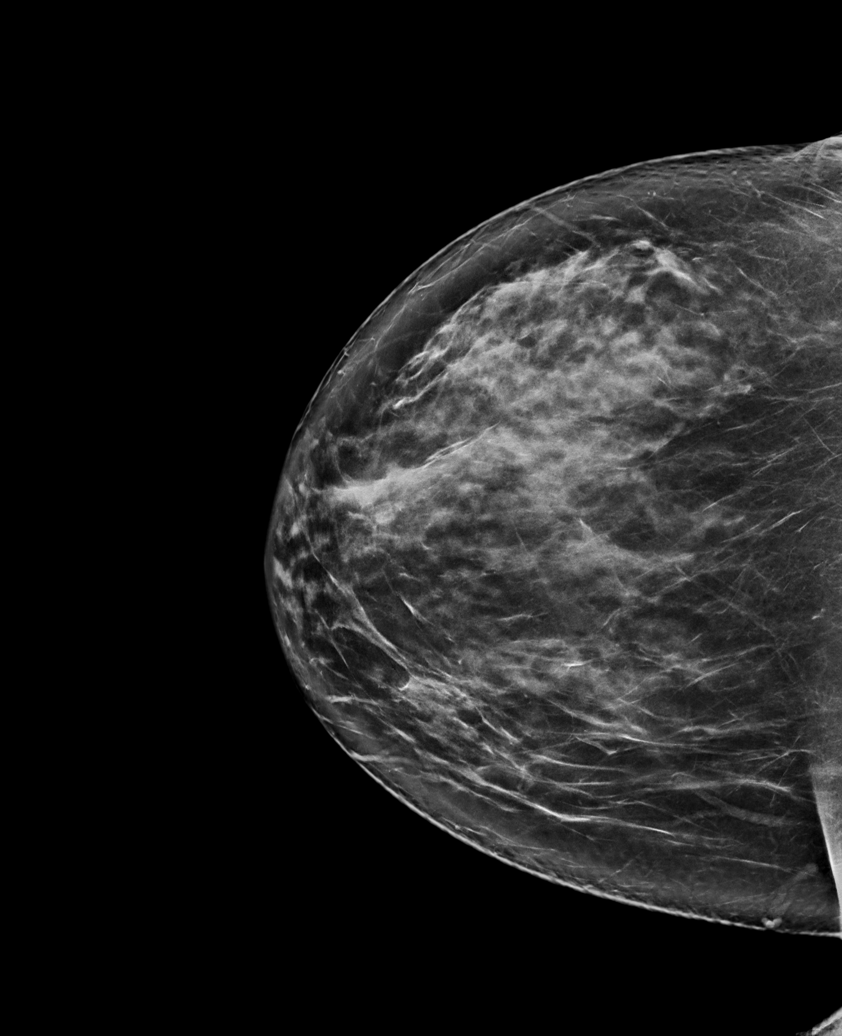

[R MLO synth-2D]
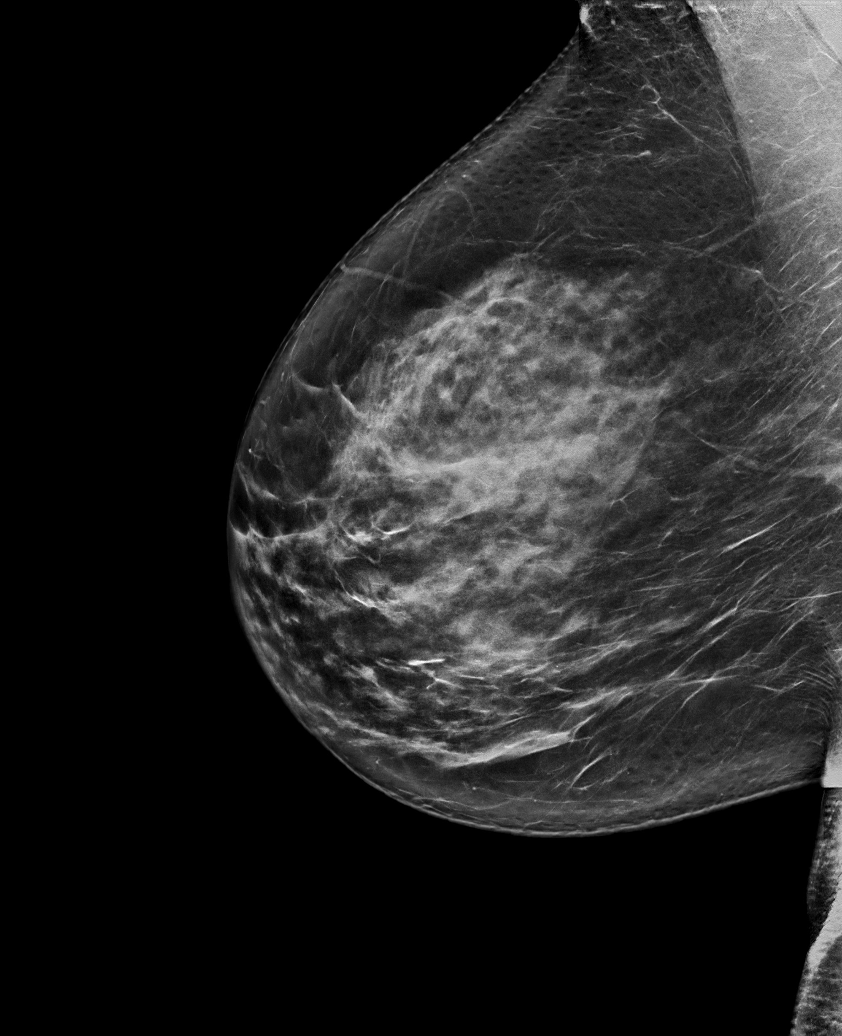

[R XCCM synth-2D]
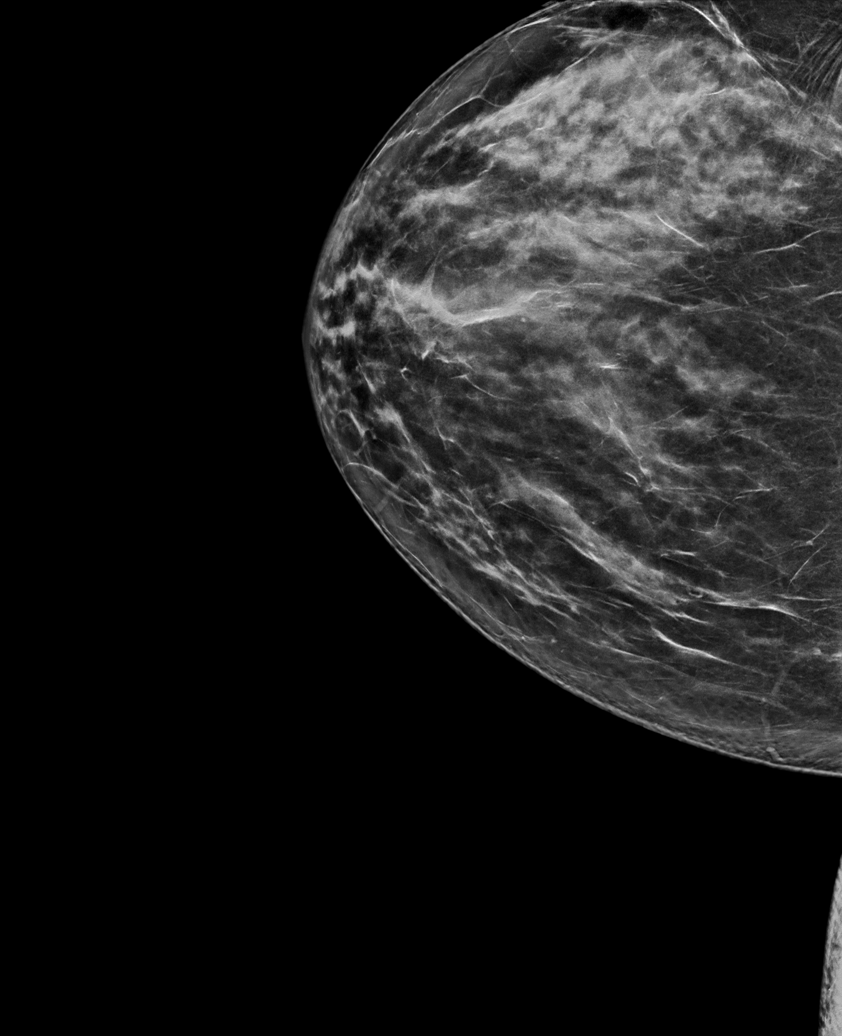

[R XCCM BREAST TOMOSYNTHESIS IMAGE tomo · tomo slice 43/84.0]
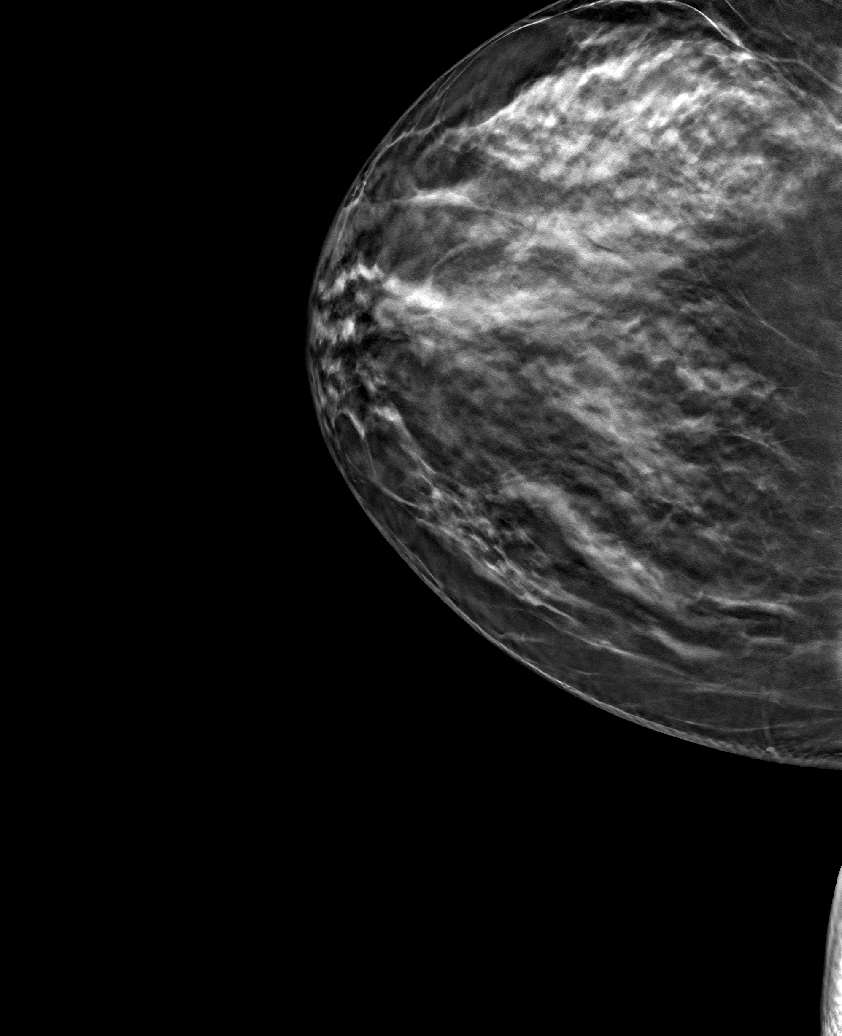

[6 of 30 positions shown; findings below may reference images not displayed]

ACR Breast Density Category c: The breast tissue is heterogeneously
dense, which may obscure small masses.
FINDINGS: There are no findings suspicious for malignancy.
IMPRESSION: No mammographic evidence of malignancy. A result letter of this
screening mammogram will be mailed directly to the patient.

RECOMMENDATION:
Screening mammogram in one year. (Code:Q3-W-BC3)

BI-RADS CATEGORY  1: Negative.

## 2022-08-13 ENCOUNTER — Other Ambulatory Visit: Payer: Self-pay | Admitting: Nurse Practitioner

## 2022-08-13 DIAGNOSIS — Z1231 Encounter for screening mammogram for malignant neoplasm of breast: Secondary | ICD-10-CM

## 2022-08-14 ENCOUNTER — Ambulatory Visit: Payer: Managed Care, Other (non HMO)

## 2022-08-21 ENCOUNTER — Encounter: Payer: Self-pay | Admitting: Nurse Practitioner

## 2022-08-21 ENCOUNTER — Ambulatory Visit (INDEPENDENT_AMBULATORY_CARE_PROVIDER_SITE_OTHER): Payer: Managed Care, Other (non HMO) | Admitting: Nurse Practitioner

## 2022-08-21 VITALS — BP 140/90 | HR 102 | Ht 69.0 in | Wt 189.2 lb

## 2022-08-21 DIAGNOSIS — R1030 Lower abdominal pain, unspecified: Secondary | ICD-10-CM | POA: Diagnosis not present

## 2022-08-21 DIAGNOSIS — M545 Low back pain, unspecified: Secondary | ICD-10-CM | POA: Diagnosis not present

## 2022-08-21 DIAGNOSIS — R109 Unspecified abdominal pain: Secondary | ICD-10-CM | POA: Diagnosis not present

## 2022-08-21 DIAGNOSIS — R319 Hematuria, unspecified: Secondary | ICD-10-CM

## 2022-08-21 HISTORY — DX: Hematuria, unspecified: R31.9

## 2022-08-21 HISTORY — DX: Unspecified abdominal pain: R10.9

## 2022-08-21 HISTORY — DX: Lower abdominal pain, unspecified: R10.30

## 2022-08-21 LAB — POCT URINALYSIS DIPSTICK
Bilirubin, UA: NEGATIVE
Glucose, UA: NEGATIVE
Ketones, UA: NEGATIVE
Leukocytes, UA: NEGATIVE
Nitrite, UA: NEGATIVE
Protein, UA: NEGATIVE
Spec Grav, UA: 1.02 (ref 1.010–1.025)
Urobilinogen, UA: 0.2 E.U./dL
pH, UA: 6 (ref 5.0–8.0)

## 2022-08-21 NOTE — Patient Instructions (Addendum)
1) UA today 2) CT abd/pelvis without contrast r/o nephrolithiasis 3) Will follow up with results of CT when available

## 2022-08-21 NOTE — Progress Notes (Signed)
Established Patient Office Visit  Subjective:  Patient ID: Jenna Durham, female    DOB: 05/26/1965  Age: 57 y.o. MRN: [id number]  Chief Complaint  Patient presents with   Follow-up    Back and lower flank pain    Acute visit, lower back and flank pain, started 2 weeks ago.  Hx of nephrolithiasis.      No other concerns at this time.   Past Medical History:  Diagnosis Date   Hypertension     Past Surgical History:  Procedure Laterality Date   APPENDECTOMY      Social History   Socioeconomic History   Marital status: Single    Spouse name: Not on file   Number of children: Not on file   Years of education: Not on file   Highest education level: Not on file  Occupational History   Not on file  Tobacco Use   Smoking status: Never   Smokeless tobacco: Never  Substance and Sexual Activity   Alcohol use: Not Currently   Drug use: Not on file   Sexual activity: Not on file  Other Topics Concern   Not on file  Social History Narrative   Not on file   Social Determinants of Health   Financial Resource Strain: Not on file  Food Insecurity: Not on file  Transportation Needs: Not on file  Physical Activity: Not on file  Stress: Not on file  Social Connections: Not on file  Intimate Partner Violence: Not on file    Family History  Problem Relation Age of Onset   Breast cancer Neg Hx     Allergies  Allergen Reactions   Amlodipine Besylate     Head aches     Review of Systems  Constitutional: Negative.   HENT: Negative.    Eyes: Negative.   Respiratory: Negative.    Cardiovascular: Negative.   Gastrointestinal:  Positive for abdominal pain.  Genitourinary:  Positive for flank pain.  Skin: Negative.   Neurological: Negative.   Endo/Heme/Allergies: Negative.   Psychiatric/Behavioral: Negative.         Objective:   BP (!) 140/90   Pulse (!) 102   Ht 5\' 9"  (1.753 m)   Wt 189 lb 3.2 oz (85.8 kg)   LMP 03/25/2015   SpO2 97%   BMI 27.94  kg/m   Vitals:   08/21/22 0947  BP: (!) 140/90  Pulse: (!) 102  Height: 5\' 9"  (1.753 m)  Weight: 189 lb 3.2 oz (85.8 kg)  SpO2: 97%  BMI (Calculated): 27.93    Physical Exam Vitals reviewed.  Constitutional:      Appearance: Normal appearance.  HENT:     Head: Normocephalic.     Nose: Nose normal.     Mouth/Throat:     Mouth: Mucous membranes are moist.  Eyes:     Pupils: Pupils are equal, round, and reactive to light.  Cardiovascular:     Rate and Rhythm: Normal rate and regular rhythm.  Pulmonary:     Effort: Pulmonary effort is normal.     Breath sounds: Normal breath sounds.  Abdominal:     General: Bowel sounds are normal.     Palpations: Abdomen is soft.  Musculoskeletal:        General: Normal range of motion.     Cervical back: Normal range of motion and neck supple.  Skin:    General: Skin is warm and dry.  Neurological:     Mental Status: She is alert  and oriented to person, place, and time.  Psychiatric:        Mood and Affect: Mood normal.        Behavior: Behavior normal.      No results found for any visits on 08/21/22.     Assessment & Plan:   Problem List Items Addressed This Visit       Other   Lower abdominal pain - Primary   Relevant Orders   CT ABDOMEN PELVIS WO CONTRAST   Flank pain   Hematuria    Return if symptoms worsen or fail to improve.   Total time spent: 35 minutes  Orson Eva, NP  08/21/2022

## 2022-08-21 NOTE — Addendum Note (Signed)
Addended by: Orson Eva on: 08/21/2022 10:21 AM   Modules accepted: Orders

## 2022-08-21 NOTE — Addendum Note (Signed)
Addended by: Feliberto Harts on: 08/21/2022 10:33 AM   Modules accepted: Orders

## 2022-09-25 ENCOUNTER — Ambulatory Visit
Admission: RE | Admit: 2022-09-25 | Discharge: 2022-09-25 | Disposition: A | Discharge status: Home / Self Care | Payer: 59 | Source: Ambulatory Visit | Attending: Nurse Practitioner | Admitting: Nurse Practitioner

## 2022-09-25 DIAGNOSIS — Z1231 Encounter for screening mammogram for malignant neoplasm of breast: Secondary | ICD-10-CM | POA: Diagnosis present

## 2022-10-05 ENCOUNTER — Other Ambulatory Visit: Payer: Self-pay | Admitting: Cardiovascular Disease

## 2022-10-05 DIAGNOSIS — I10 Essential (primary) hypertension: Secondary | ICD-10-CM

## 2022-11-08 ENCOUNTER — Other Ambulatory Visit: Payer: Self-pay | Admitting: Nurse Practitioner

## 2022-11-08 DIAGNOSIS — E785 Hyperlipidemia, unspecified: Secondary | ICD-10-CM

## 2022-12-14 ENCOUNTER — Ambulatory Visit: Payer: Managed Care, Other (non HMO) | Admitting: Cardiology

## 2022-12-31 ENCOUNTER — Other Ambulatory Visit: Payer: Managed Care, Other (non HMO)

## 2022-12-31 DIAGNOSIS — R5383 Other fatigue: Secondary | ICD-10-CM

## 2022-12-31 DIAGNOSIS — E785 Hyperlipidemia, unspecified: Secondary | ICD-10-CM

## 2022-12-31 DIAGNOSIS — I10 Essential (primary) hypertension: Secondary | ICD-10-CM

## 2022-12-31 DIAGNOSIS — R7301 Impaired fasting glucose: Secondary | ICD-10-CM

## 2023-01-01 LAB — CMP14+EGFR
ALT: 20 IU/L (ref 0–32)
AST: 22 IU/L (ref 0–40)
Albumin: 4.1 g/dL (ref 3.8–4.9)
Alkaline Phosphatase: 75 IU/L (ref 44–121)
BUN/Creatinine Ratio: 16 (ref 9–23)
BUN: 13 mg/dL (ref 6–24)
Bilirubin Total: 0.6 mg/dL (ref 0.0–1.2)
CO2: 23 mmol/L (ref 20–29)
Calcium: 9.4 mg/dL (ref 8.7–10.2)
Chloride: 102 mmol/L (ref 96–106)
Creatinine, Ser: 0.79 mg/dL (ref 0.57–1.00)
Globulin, Total: 2.9 g/dL (ref 1.5–4.5)
Glucose: 81 mg/dL (ref 70–99)
Potassium: 4.3 mmol/L (ref 3.5–5.2)
Sodium: 139 mmol/L (ref 134–144)
Total Protein: 7 g/dL (ref 6.0–8.5)
eGFR: 87 mL/min/{1.73_m2} (ref >59)

## 2023-01-01 LAB — CBC WITH DIFFERENTIAL/PLATELET
Basophils Absolute: 0 10*3/uL (ref 0.0–0.2)
Basos: 1 %
EOS (ABSOLUTE): 0.1 10*3/uL (ref 0.0–0.4)
Eos: 3 %
Hematocrit: 39.8 % (ref 34.0–46.6)
Hemoglobin: 13 g/dL (ref 11.1–15.9)
Immature Grans (Abs): 0 10*3/uL (ref 0.0–0.1)
Immature Granulocytes: 0 %
Lymphocytes Absolute: 2.1 10*3/uL (ref 0.7–3.1)
Lymphs: 39 %
MCH: 29.7 pg (ref 26.6–33.0)
MCHC: 32.7 g/dL (ref 31.5–35.7)
MCV: 91 fL (ref 79–97)
Monocytes Absolute: 0.4 10*3/uL (ref 0.1–0.9)
Monocytes: 8 %
Neutrophils Absolute: 2.7 10*3/uL (ref 1.4–7.0)
Neutrophils: 49 %
Platelets: 305 10*3/uL (ref 150–450)
RBC: 4.37 x10E6/uL (ref 3.77–5.28)
RDW: 12.2 % (ref 11.7–15.4)
WBC: 5.3 10*3/uL (ref 3.4–10.8)

## 2023-01-01 LAB — HEMOGLOBIN A1C
Est. average glucose Bld gHb Est-mCnc: 114 mg/dL
Hgb A1c MFr Bld: 5.6 % (ref 4.8–5.6)

## 2023-01-01 LAB — LIPID PANEL
Chol/HDL Ratio: 2 ratio (ref 0.0–4.4)
Cholesterol, Total: 129 mg/dL (ref 100–199)
HDL: 65 mg/dL (ref >39)
LDL Chol Calc (NIH): 52 mg/dL (ref 0–99)
Triglycerides: 52 mg/dL (ref 0–149)
VLDL Cholesterol Cal: 12 mg/dL (ref 5–40)

## 2023-01-01 LAB — TSH: TSH: 1.6 u[IU]/mL (ref 0.450–4.500)

## 2023-01-04 ENCOUNTER — Encounter: Payer: Self-pay | Admitting: Cardiology

## 2023-01-04 ENCOUNTER — Ambulatory Visit (INDEPENDENT_AMBULATORY_CARE_PROVIDER_SITE_OTHER): Payer: Managed Care, Other (non HMO) | Admitting: Cardiology

## 2023-01-04 VITALS — BP 128/82 | HR 90 | Ht 69.0 in | Wt 187.0 lb

## 2023-01-04 DIAGNOSIS — Z1211 Encounter for screening for malignant neoplasm of colon: Secondary | ICD-10-CM | POA: Diagnosis not present

## 2023-01-04 DIAGNOSIS — I10 Essential (primary) hypertension: Secondary | ICD-10-CM | POA: Diagnosis not present

## 2023-01-04 DIAGNOSIS — E782 Mixed hyperlipidemia: Secondary | ICD-10-CM | POA: Diagnosis not present

## 2023-01-04 NOTE — Progress Notes (Signed)
Established Patient Office Visit  Subjective:  Patient ID: Jenna Durham, female    DOB: 03-19-1966  Age: 57 y.o. MRN: [id number]  Chief Complaint  Patient presents with   Follow-up    6 month follow up    Patient in office for 6 month follow up, discuss recent lab work. Patient reports feeling well.  Over due for pap smear. Will complete at next visit.  Over due for colonoscopy, order placed for cologuard.  Review recent lab work, all within normal limits.  Up to date on mammogram.   No other concerns at this time.   Past Medical History:  Diagnosis Date   Flank pain 08/21/2022   Hematuria 08/21/2022   Hypertension    Lower abdominal pain 08/21/2022   Other fatigue 07/05/2022   Right wrist pain 06/14/2022   Seasonal allergic rhinitis due to pollen 06/27/2022   URI with cough and congestion 06/27/2022    Past Surgical History:  Procedure Laterality Date   APPENDECTOMY      Social History   Socioeconomic History   Marital status: Single    Spouse name: Not on file   Number of children: Not on file   Years of education: Not on file   Highest education level: Not on file  Occupational History   Not on file  Tobacco Use   Smoking status: Never   Smokeless tobacco: Never  Substance and Sexual Activity   Alcohol use: Not Currently   Drug use: Not on file   Sexual activity: Not on file  Other Topics Concern   Not on file  Social History Narrative   Not on file   Social Determinants of Health   Financial Resource Strain: Not on file  Food Insecurity: Not on file  Transportation Needs: Not on file  Physical Activity: Not on file  Stress: Not on file  Social Connections: Not on file  Intimate Partner Violence: Not on file    Family History  Problem Relation Age of Onset   Breast cancer Neg Hx     Allergies  Allergen Reactions   Amlodipine Besylate     Head aches    Amoxicillin-Pot Clavulanate Other (See Comments)    NO ALLERGY to  ingredient    Just unable to swallow large pills    Review of Systems  Constitutional: Negative.   HENT: Negative.    Eyes: Negative.   Respiratory: Negative.  Negative for shortness of breath.   Cardiovascular: Negative.  Negative for chest pain.  Gastrointestinal: Negative.  Negative for abdominal pain, constipation and diarrhea.  Genitourinary: Negative.   Musculoskeletal:  Negative for joint pain and myalgias.  Skin: Negative.   Neurological: Negative.  Negative for dizziness and headaches.  Endo/Heme/Allergies: Negative.   All other systems reviewed and are negative.      Objective:   BP 128/82 (BP Location: Left Arm, Patient Position: Sitting, Cuff Size: Large)   Pulse 90   Ht 5\' 9"  (1.753 m)   Wt 187 lb (84.8 kg)   LMP 03/25/2015   SpO2 98%   BMI 27.62 kg/m   Vitals:   01/04/23 1502 01/04/23 1516  BP: (!) 150/90 128/82  Pulse: 90   Height: 5\' 9"  (1.753 m)   Weight: 187 lb (84.8 kg)   SpO2: 98%   BMI (Calculated): 27.6     Physical Exam Vitals and nursing note reviewed.  Constitutional:      Appearance: Normal appearance. She is normal weight.  HENT:  Head: Normocephalic and atraumatic.     Nose: Nose normal.     Mouth/Throat:     Mouth: Mucous membranes are moist.  Eyes:     Extraocular Movements: Extraocular movements intact.     Conjunctiva/sclera: Conjunctivae normal.     Pupils: Pupils are equal, round, and reactive to light.  Cardiovascular:     Rate and Rhythm: Normal rate and regular rhythm.     Pulses: Normal pulses.     Heart sounds: Normal heart sounds.  Pulmonary:     Effort: Pulmonary effort is normal.     Breath sounds: Normal breath sounds.  Abdominal:     General: Abdomen is flat. Bowel sounds are normal.     Palpations: Abdomen is soft.  Musculoskeletal:        General: Normal range of motion.     Cervical back: Normal range of motion.  Skin:    General: Skin is warm and dry.  Neurological:     General: No focal deficit  present.     Mental Status: She is alert and oriented to person, place, and time.  Psychiatric:        Mood and Affect: Mood normal.        Behavior: Behavior normal.        Thought Content: Thought content normal.        Judgment: Judgment normal.      No results found for any visits on 01/04/23.  Recent Results (from the past 2160 hour(s))  Hemoglobin A1c     Status: None   Collection Time: 12/31/22  9:55 AM  Result Value Ref Range   Hgb A1c MFr Bld 5.6 4.8 - 5.6 %    Comment:          Prediabetes: 5.7 - 6.4          Diabetes: >6.4          Glycemic control for adults with diabetes: <7.0    Est. average glucose Bld gHb Est-mCnc 114 mg/dL  TSH     Status: None   Collection Time: 12/31/22  9:55 AM  Result Value Ref Range   TSH 1.600 0.450 - 4.500 uIU/mL  CMP14+EGFR     Status: None   Collection Time: 12/31/22  9:55 AM  Result Value Ref Range   Glucose 81 70 - 99 mg/dL   BUN 13 6 - 24 mg/dL   Creatinine, Ser 1.61 0.57 - 1.00 mg/dL   eGFR 87 >09 UE/AVW/0.98   BUN/Creatinine Ratio 16 9 - 23   Sodium 139 134 - 144 mmol/L   Potassium 4.3 3.5 - 5.2 mmol/L   Chloride 102 96 - 106 mmol/L   CO2 23 20 - 29 mmol/L   Calcium 9.4 8.7 - 10.2 mg/dL   Total Protein 7.0 6.0 - 8.5 g/dL   Albumin 4.1 3.8 - 4.9 g/dL   Globulin, Total 2.9 1.5 - 4.5 g/dL   Bilirubin Total 0.6 0.0 - 1.2 mg/dL   Alkaline Phosphatase 75 44 - 121 IU/L   AST 22 0 - 40 IU/L   ALT 20 0 - 32 IU/L  Lipid panel     Status: None   Collection Time: 12/31/22  9:55 AM  Result Value Ref Range   Cholesterol, Total 129 100 - 199 mg/dL   Triglycerides 52 0 - 149 mg/dL   HDL 65 >11 mg/dL   VLDL Cholesterol Cal 12 5 - 40 mg/dL   LDL Chol Calc (NIH) 52 0 - 99  mg/dL   Chol/HDL Ratio 2.0 0.0 - 4.4 ratio    Comment:                                   T. Chol/HDL Ratio                                             Men  Women                               1/2 Avg.Risk  3.4    3.3                                   Avg.Risk   5.0    4.4                                2X Avg.Risk  9.6    7.1                                3X Avg.Risk 23.4   11.0   CBC with Differential/Platelet     Status: None   Collection Time: 12/31/22  9:55 AM  Result Value Ref Range   WBC 5.3 3.4 - 10.8 x10E3/uL   RBC 4.37 3.77 - 5.28 x10E6/uL   Hemoglobin 13.0 11.1 - 15.9 g/dL   Hematocrit 46.9 62.9 - 46.6 %   MCV 91 79 - 97 fL   MCH 29.7 26.6 - 33.0 pg   MCHC 32.7 31.5 - 35.7 g/dL   RDW 52.8 41.3 - 24.4 %   Platelets 305 150 - 450 x10E3/uL   Neutrophils 49 Not Estab. %   Lymphs 39 Not Estab. %   Monocytes 8 Not Estab. %   Eos 3 Not Estab. %   Basos 1 Not Estab. %   Neutrophils Absolute 2.7 1.4 - 7.0 x10E3/uL   Lymphocytes Absolute 2.1 0.7 - 3.1 x10E3/uL   Monocytes Absolute 0.4 0.1 - 0.9 x10E3/uL   EOS (ABSOLUTE) 0.1 0.0 - 0.4 x10E3/uL   Basophils Absolute 0.0 0.0 - 0.2 x10E3/uL   Immature Granulocytes 0 Not Estab. %   Immature Grans (Abs) 0.0 0.0 - 0.1 x10E3/uL      Assessment & Plan:  Cologuard order placed. Pap smear at next visit.  Continue diet and exercise.   Problem List Items Addressed This Visit       Cardiovascular and Mediastinum   Essential hypertension, benign - Primary     Other   Mixed hyperlipidemia   Other Visit Diagnoses     Colon cancer screening       Relevant Orders   Cologuard       Return in about 4 months (around 05/06/2023) for with fasting labs prior, needs pap smear.   Total time spent: 25 minutes  [employer], NP  01/04/2023   This document may have been prepared by Dragon Voice Recognition software and as such may include unintentional dictation errors.

## 2023-02-01 ENCOUNTER — Encounter: Payer: Self-pay | Admitting: Cardiovascular Disease

## 2023-02-01 ENCOUNTER — Ambulatory Visit (INDEPENDENT_AMBULATORY_CARE_PROVIDER_SITE_OTHER): Payer: Managed Care, Other (non HMO) | Admitting: Cardiovascular Disease

## 2023-02-01 VITALS — BP 146/88 | HR 84 | Ht 69.0 in | Wt 185.0 lb

## 2023-02-01 DIAGNOSIS — R002 Palpitations: Secondary | ICD-10-CM

## 2023-02-01 DIAGNOSIS — R0602 Shortness of breath: Secondary | ICD-10-CM | POA: Diagnosis not present

## 2023-02-01 DIAGNOSIS — E782 Mixed hyperlipidemia: Secondary | ICD-10-CM | POA: Diagnosis not present

## 2023-02-01 DIAGNOSIS — I10 Essential (primary) hypertension: Secondary | ICD-10-CM | POA: Diagnosis not present

## 2023-02-01 DIAGNOSIS — I340 Nonrheumatic mitral (valve) insufficiency: Secondary | ICD-10-CM | POA: Diagnosis not present

## 2023-02-01 MED ORDER — LOSARTAN POTASSIUM-HCTZ 100-12.5 MG PO TABS
1.0000 | ORAL_TABLET | Freq: Every day | ORAL | 2 refills | Status: DC
Start: 2023-02-01 — End: 2023-05-09

## 2023-02-01 NOTE — Progress Notes (Signed)
Cardiology Office Note   Date:  02/01/2023   ID:  Jenna Durham, DOB [birth year], MRN [id number]  PCP:  Orson Eva, NP (Inactive)  Cardiologist:  Adrian Blackwater, MD      History of Present Illness: Jenna Durham is a 57 y.o. female who presents for  Chief Complaint  Patient presents with   Follow-up    6 Months Follow Up    Feeling fine, but BP is up      Past Medical History:  Diagnosis Date   Flank pain 08/21/2022   Hematuria 08/21/2022   Hypertension    Lower abdominal pain 08/21/2022   Other fatigue 07/05/2022   Right wrist pain 06/14/2022   Seasonal allergic rhinitis due to pollen 06/27/2022   URI with cough and congestion 06/27/2022     Past Surgical History:  Procedure Laterality Date   APPENDECTOMY       Current Outpatient Medications  Medication Sig Dispense Refill   losartan-hydrochlorothiazide (HYZAAR) 100-12.5 MG tablet Take 1 tablet by mouth daily. 30 tablet 2   rosuvastatin (CRESTOR) 20 MG tablet TAKE 1 TABLET BY MOUTH NIGHTLY AT BEDTIME FOR HIGH CHOLESTEROL 90 tablet 3   No current facility-administered medications for this visit.    Allergies:   Amlodipine besylate and Amoxicillin-pot clavulanate    Social History:   reports that she has never smoked. She has never used smokeless tobacco. She reports that she does not currently use alcohol. No history on file for drug use.   Family History:  family history is not on file.    ROS:     Review of Systems  Constitutional: Negative.   HENT: Negative.    Eyes: Negative.   Respiratory: Negative.    Gastrointestinal: Negative.   Genitourinary: Negative.   Musculoskeletal: Negative.   Skin: Negative.   Neurological: Negative.   Endo/Heme/Allergies: Negative.   Psychiatric/Behavioral: Negative.    All other systems reviewed and are negative.     All other systems are reviewed and negative.    PHYSICAL EXAM: VS:  BP (!) 146/88   Pulse 84   Ht 5\' 9"  (1.753 m)   Wt  185 lb (83.9 kg)   LMP 03/25/2015   SpO2 98%   BMI 27.32 kg/m  , BMI Body mass index is 27.32 kg/m. Last weight:  Wt Readings from Last 3 Encounters:  02/01/23 185 lb (83.9 kg)  01/04/23 187 lb (84.8 kg)  08/21/22 189 lb 3.2 oz (85.8 kg)     Physical Exam Constitutional:      Appearance: Normal appearance.  Cardiovascular:     Rate and Rhythm: Normal rate and regular rhythm.     Heart sounds: Normal heart sounds.  Pulmonary:     Effort: Pulmonary effort is normal.     Breath sounds: Normal breath sounds.  Musculoskeletal:     Right lower leg: No edema.     Left lower leg: No edema.  Neurological:     Mental Status: She is alert.       EKG:   Recent Labs: 12/31/2022: ALT 20; BUN 13; Creatinine, Ser 0.79; Hemoglobin 13.0; Platelets 305; Potassium 4.3; Sodium 139; TSH 1.600    Lipid Panel    Component Value Date/Time   CHOL 129 12/31/2022 0955   TRIG 52 12/31/2022 0955   HDL 65 12/31/2022 0955   CHOLHDL 2.0 12/31/2022 0955   LDLCALC 52 12/31/2022 0955      Other studies Reviewed: Additional studies/ records that were reviewed today  include:  Review of the above records demonstrates:       No data to display            ASSESSMENT AND PLAN:    ICD-10-CM   1. Essential hypertension, benign  I10 PCV ECHOCARDIOGRAM COMPLETE    losartan-hydrochlorothiazide (HYZAAR) 100-12.5 MG tablet    2. Mixed hyperlipidemia  E78.2 PCV ECHOCARDIOGRAM COMPLETE    losartan-hydrochlorothiazide (HYZAAR) 100-12.5 MG tablet    3. Nonrheumatic mitral valve regurgitation  I34.0 PCV ECHOCARDIOGRAM COMPLETE    losartan-hydrochlorothiazide (HYZAAR) 100-12.5 MG tablet    4. SOB (shortness of breath)  R06.02 PCV ECHOCARDIOGRAM COMPLETE    losartan-hydrochlorothiazide (HYZAAR) 100-12.5 MG tablet   get ech    5. Palpitation  R00.2 PCV ECHOCARDIOGRAM COMPLETE    losartan-hydrochlorothiazide (HYZAAR) 100-12.5 MG tablet   Had 3 weeks ago       Problem List Items Addressed  This Visit       Cardiovascular and Mediastinum   Essential hypertension, benign - Primary   Relevant Medications   losartan-hydrochlorothiazide (HYZAAR) 100-12.5 MG tablet   Other Relevant Orders   PCV ECHOCARDIOGRAM COMPLETE     Other   Mixed hyperlipidemia   Relevant Medications   losartan-hydrochlorothiazide (HYZAAR) 100-12.5 MG tablet   Other Relevant Orders   PCV ECHOCARDIOGRAM COMPLETE   Other Visit Diagnoses     Nonrheumatic mitral valve regurgitation       Relevant Medications   losartan-hydrochlorothiazide (HYZAAR) 100-12.5 MG tablet   Other Relevant Orders   PCV ECHOCARDIOGRAM COMPLETE   SOB (shortness of breath)       get ech   Relevant Medications   losartan-hydrochlorothiazide (HYZAAR) 100-12.5 MG tablet   Other Relevant Orders   PCV ECHOCARDIOGRAM COMPLETE   Palpitation       Had 3 weeks ago   Relevant Medications   losartan-hydrochlorothiazide (HYZAAR) 100-12.5 MG tablet   Other Relevant Orders   PCV ECHOCARDIOGRAM COMPLETE          Disposition:   Return in about 4 weeks (around 03/01/2023) for echo and f/u.    Total time spent: 30 minutes  Signed,  Adrian Blackwater, MD  02/01/2023 4:04 PM    [facility]

## 2023-02-27 ENCOUNTER — Other Ambulatory Visit: Payer: Managed Care, Other (non HMO)

## 2023-02-27 ENCOUNTER — Ambulatory Visit: Payer: Managed Care, Other (non HMO)

## 2023-02-27 DIAGNOSIS — I340 Nonrheumatic mitral (valve) insufficiency: Secondary | ICD-10-CM

## 2023-02-27 DIAGNOSIS — I10 Essential (primary) hypertension: Secondary | ICD-10-CM

## 2023-02-27 DIAGNOSIS — I351 Nonrheumatic aortic (valve) insufficiency: Secondary | ICD-10-CM | POA: Diagnosis not present

## 2023-02-27 DIAGNOSIS — R0602 Shortness of breath: Secondary | ICD-10-CM

## 2023-02-27 DIAGNOSIS — I361 Nonrheumatic tricuspid (valve) insufficiency: Secondary | ICD-10-CM

## 2023-02-27 DIAGNOSIS — R002 Palpitations: Secondary | ICD-10-CM

## 2023-02-27 DIAGNOSIS — E782 Mixed hyperlipidemia: Secondary | ICD-10-CM

## 2023-03-05 ENCOUNTER — Telehealth: Payer: Self-pay

## 2023-03-05 ENCOUNTER — Other Ambulatory Visit: Payer: Managed Care, Other (non HMO)

## 2023-03-05 ENCOUNTER — Other Ambulatory Visit: Payer: Self-pay | Admitting: Cardiology

## 2023-03-05 NOTE — Telephone Encounter (Signed)
Sent message via mychart

## 2023-03-12 ENCOUNTER — Encounter: Payer: Self-pay | Admitting: Cardiology

## 2023-03-12 ENCOUNTER — Ambulatory Visit (INDEPENDENT_AMBULATORY_CARE_PROVIDER_SITE_OTHER): Payer: Managed Care, Other (non HMO) | Admitting: Cardiology

## 2023-03-12 VITALS — BP 100/70 | HR 85 | Ht 69.0 in | Wt 186.8 lb

## 2023-03-12 DIAGNOSIS — E782 Mixed hyperlipidemia: Secondary | ICD-10-CM

## 2023-03-12 DIAGNOSIS — I10 Essential (primary) hypertension: Secondary | ICD-10-CM

## 2023-03-12 NOTE — Assessment & Plan Note (Signed)
Patient doing well. Normal EF on echo. B/p well controlled.Patient tolerating medication adjustments. Denies dizziness. Continue same medications.

## 2023-03-12 NOTE — Progress Notes (Signed)
Cardiology Office Note   Date:  03/12/2023   ID:  Jenna Durham, DOB 05/22/1965, MRN [id number]  PCP:  Orson Eva, NP  Cardiologist:  Marisue Ivan, NP      History of Present Illness: Jenna Durham is a 57 y.o. female who presents for  Chief Complaint  Patient presents with   Follow-up    Echo results    Patient in office to discuss echo results. Patient doing well, no complaints today.       Past Medical History:  Diagnosis Date   Flank pain 08/21/2022   Hematuria 08/21/2022   Hypertension    Lower abdominal pain 08/21/2022   Other fatigue 07/05/2022   Right wrist pain 06/14/2022   Seasonal allergic rhinitis due to pollen 06/27/2022   URI with cough and congestion 06/27/2022     Past Surgical History:  Procedure Laterality Date   APPENDECTOMY       Current Outpatient Medications  Medication Sig Dispense Refill   losartan-hydrochlorothiazide (HYZAAR) 100-12.5 MG tablet Take 1 tablet by mouth daily. 30 tablet 2   rosuvastatin (CRESTOR) 20 MG tablet TAKE 1 TABLET BY MOUTH NIGHTLY AT BEDTIME FOR HIGH CHOLESTEROL 90 tablet 3   No current facility-administered medications for this visit.    Allergies:   Amlodipine besylate and Amoxicillin-pot clavulanate    Social History:   reports that she has never smoked. She has never used smokeless tobacco. She reports that she does not currently use alcohol. No history on file for drug use.   Family History:  family history is not on file.    ROS:     Review of Systems  Constitutional: Negative.   HENT: Negative.    Eyes: Negative.   Respiratory: Negative.    Cardiovascular: Negative.   Gastrointestinal: Negative.   Genitourinary: Negative.   Musculoskeletal: Negative.   Skin: Negative.   Neurological: Negative.   Endo/Heme/Allergies: Negative.   Psychiatric/Behavioral: Negative.    All other systems reviewed and are negative.     All other systems are reviewed and negative.     PHYSICAL EXAM: VS:  BP 100/70   Pulse 85   Ht 5\' 9"  (1.753 m)   Wt 186 lb 12.8 oz (84.7 kg)   LMP 03/25/2015   SpO2 96%   BMI 27.59 kg/m  , BMI Body mass index is 27.59 kg/m. Last weight:  Wt Readings from Last 3 Encounters:  03/12/23 186 lb 12.8 oz (84.7 kg)  02/01/23 185 lb (83.9 kg)  01/04/23 187 lb (84.8 kg)     Physical Exam Vitals and nursing note reviewed.  Constitutional:      Appearance: Normal appearance. She is normal weight.  HENT:     Head: Normocephalic and atraumatic.     Nose: Nose normal.     Mouth/Throat:     Mouth: Mucous membranes are moist.     Pharynx: Oropharynx is clear.  Eyes:     Conjunctiva/sclera: Conjunctivae normal.     Pupils: Pupils are equal, round, and reactive to light.  Cardiovascular:     Rate and Rhythm: Normal rate and regular rhythm.     Pulses: Normal pulses.     Heart sounds: Normal heart sounds.  Pulmonary:     Effort: Pulmonary effort is normal.     Breath sounds: Normal breath sounds.  Abdominal:     General: Abdomen is flat. Bowel sounds are normal.     Palpations: Abdomen is soft.  Musculoskeletal:  General: Normal range of motion.     Cervical back: Normal range of motion.  Skin:    General: Skin is warm and dry.  Neurological:     General: No focal deficit present.     Mental Status: She is alert and oriented to person, place, and time. Mental status is at baseline.  Psychiatric:        Mood and Affect: Mood normal.        Behavior: Behavior normal.      EKG: none today  Recent Labs: 12/31/2022: ALT 20; BUN 13; Creatinine, Ser 0.79; Hemoglobin 13.0; Platelets 305; Potassium 4.3; Sodium 139; TSH 1.600    Lipid Panel    Component Value Date/Time   CHOL 129 12/31/2022 0955   TRIG 52 12/31/2022 0955   HDL 65 12/31/2022 0955   CHOLHDL 2.0 12/31/2022 0955   LDLCALC 52 12/31/2022 0955      Other studies Reviewed: echo 02/2023   ASSESSMENT AND PLAN:    ICD-10-CM   1. Essential  hypertension, benign  I10     2. Mixed hyperlipidemia  E78.2        Problem List Items Addressed This Visit       Cardiovascular and Mediastinum   Essential hypertension, benign - Primary    Patient doing well. Normal EF on echo. B/p well controlled.Patient tolerating medication adjustments. Denies dizziness. Continue same medications.         Other   Mixed hyperlipidemia     Disposition:   Return in about 4 months (around 07/12/2023) for cardiology.    Total time spent: 25 minutes  Signed,  Marisue Ivan, NP  03/12/2023 3:59 PM    [facility]

## 2023-04-23 ENCOUNTER — Telehealth: Payer: Self-pay

## 2023-04-23 NOTE — Telephone Encounter (Signed)
Pt called and is requesting an alternative BP medication, current medication medication is giving her cramps even when drinking plenty of water- please advise

## 2023-05-06 ENCOUNTER — Ambulatory Visit: Payer: Managed Care, Other (non HMO) | Admitting: Family

## 2023-05-09 ENCOUNTER — Other Ambulatory Visit: Payer: Self-pay

## 2023-05-09 DIAGNOSIS — I10 Essential (primary) hypertension: Secondary | ICD-10-CM

## 2023-05-09 DIAGNOSIS — R002 Palpitations: Secondary | ICD-10-CM

## 2023-05-09 DIAGNOSIS — I340 Nonrheumatic mitral (valve) insufficiency: Secondary | ICD-10-CM

## 2023-05-09 DIAGNOSIS — E782 Mixed hyperlipidemia: Secondary | ICD-10-CM

## 2023-05-09 DIAGNOSIS — R0602 Shortness of breath: Secondary | ICD-10-CM

## 2023-05-09 MED ORDER — LOSARTAN POTASSIUM-HCTZ 100-12.5 MG PO TABS: 1.0000 | ORAL_TABLET | Freq: Every day | ORAL | 2 refills | Status: DC

## 2023-07-02 ENCOUNTER — Other Ambulatory Visit: Payer: Self-pay | Admitting: Cardiovascular Disease

## 2023-07-02 DIAGNOSIS — R002 Palpitations: Secondary | ICD-10-CM

## 2023-07-02 DIAGNOSIS — I10 Essential (primary) hypertension: Secondary | ICD-10-CM

## 2023-07-02 DIAGNOSIS — R0602 Shortness of breath: Secondary | ICD-10-CM

## 2023-07-02 DIAGNOSIS — E782 Mixed hyperlipidemia: Secondary | ICD-10-CM

## 2023-07-02 DIAGNOSIS — I340 Nonrheumatic mitral (valve) insufficiency: Secondary | ICD-10-CM

## 2023-07-12 ENCOUNTER — Ambulatory Visit: Payer: Managed Care, Other (non HMO) | Admitting: Cardiovascular Disease

## 2023-07-19 ENCOUNTER — Encounter: Payer: Self-pay | Admitting: Cardiovascular Disease

## 2023-07-19 ENCOUNTER — Ambulatory Visit: Payer: Managed Care, Other (non HMO) | Admitting: Cardiovascular Disease

## 2023-07-19 VITALS — BP 108/66 | HR 92 | Ht 69.0 in | Wt 181.0 lb

## 2023-07-19 DIAGNOSIS — E782 Mixed hyperlipidemia: Secondary | ICD-10-CM | POA: Diagnosis not present

## 2023-07-19 DIAGNOSIS — R0602 Shortness of breath: Secondary | ICD-10-CM

## 2023-07-19 DIAGNOSIS — I340 Nonrheumatic mitral (valve) insufficiency: Secondary | ICD-10-CM

## 2023-07-19 DIAGNOSIS — I10 Essential (primary) hypertension: Secondary | ICD-10-CM | POA: Diagnosis not present

## 2023-07-19 NOTE — Progress Notes (Signed)
 Cardiology Office Note   Date:  07/19/2023   ID:  Jenna Durham, DOB 10/07/1965, MRN [id number]  PCP:  Orson Eva, NP  Cardiologist:  Adrian Blackwater, MD      History of Present Illness: Jenna Durham is a 58 y.o. female who presents for  Chief Complaint  Patient presents with   Follow-up    4 Months Follow Up    Doing well      Past Medical History:  Diagnosis Date   Flank pain 08/21/2022   Hematuria 08/21/2022   Hypertension    Lower abdominal pain 08/21/2022   Other fatigue 07/05/2022   Right wrist pain 06/14/2022   Seasonal allergic rhinitis due to pollen 06/27/2022   URI with cough and congestion 06/27/2022     Past Surgical History:  Procedure Laterality Date   APPENDECTOMY       Current Outpatient Medications  Medication Sig Dispense Refill   diclofenac (VOLTAREN) 75 MG EC tablet Take 75 mg by mouth 2 (two) times daily.     losartan-hydrochlorothiazide (HYZAAR) 100-12.5 MG tablet TAKE 1 TABLET BY MOUTH EVERY DAY 30 tablet 2   rosuvastatin (CRESTOR) 20 MG tablet TAKE 1 TABLET BY MOUTH NIGHTLY AT BEDTIME FOR HIGH CHOLESTEROL 90 tablet 3   No current facility-administered medications for this visit.    Allergies:   Amlodipine besylate and Amoxicillin-pot clavulanate    Social History:   reports that she has never smoked. She has never used smokeless tobacco. She reports that she does not currently use alcohol. No history on file for drug use.   Family History:  family history is not on file.    ROS:     Review of Systems  Constitutional: Negative.   HENT: Negative.    Eyes: Negative.   Respiratory: Negative.    Gastrointestinal: Negative.   Genitourinary: Negative.   Musculoskeletal: Negative.   Skin: Negative.   Neurological: Negative.   Endo/Heme/Allergies: Negative.   Psychiatric/Behavioral: Negative.    All other systems reviewed and are negative.     All other systems are reviewed and negative.    PHYSICAL  EXAM: VS:  BP 108/66   Pulse 92   Ht 5\' 9"  (1.753 m)   Wt 181 lb (82.1 kg)   LMP 03/25/2015   SpO2 100%   BMI 26.73 kg/m  , BMI Body mass index is 26.73 kg/m. Last weight:  Wt Readings from Last 3 Encounters:  07/19/23 181 lb (82.1 kg)  03/12/23 186 lb 12.8 oz (84.7 kg)  02/01/23 185 lb (83.9 kg)     Physical Exam Constitutional:      Appearance: Normal appearance.  Cardiovascular:     Rate and Rhythm: Normal rate and regular rhythm.     Heart sounds: Normal heart sounds.  Pulmonary:     Effort: Pulmonary effort is normal.     Breath sounds: Normal breath sounds.  Musculoskeletal:     Right lower leg: No edema.     Left lower leg: No edema.  Neurological:     Mental Status: She is alert.       EKG:   Recent Labs: 12/31/2022: ALT 20; BUN 13; Creatinine, Ser 0.79; Hemoglobin 13.0; Platelets 305; Potassium 4.3; Sodium 139; TSH 1.600    Lipid Panel    Component Value Date/Time   CHOL 129 12/31/2022 0955   TRIG 52 12/31/2022 0955   HDL 65 12/31/2022 0955   CHOLHDL 2.0 12/31/2022 0955   LDLCALC 52 12/31/2022 0955  Other studies Reviewed: Additional studies/ records that were reviewed today include:  Review of the above records demonstrates:       No data to display            ASSESSMENT AND PLAN:    ICD-10-CM   1. Essential hypertension, benign  I10    stable    2. Mixed hyperlipidemia  E78.2    LDL52    3. Nonrheumatic mitral valve regurgitation  I34.0     4. SOB (shortness of breath)  R06.02    no longer SOB       Problem List Items Addressed This Visit       Cardiovascular and Mediastinum   Essential hypertension, benign - Primary     Other   Mixed hyperlipidemia   Other Visit Diagnoses       Nonrheumatic mitral valve regurgitation         SOB (shortness of breath)       no longer SOB          Disposition:   Return in about 3 months (around 10/19/2023).    Total time spent: 30 minutes  Signed,  Adrian Blackwater, MD   07/19/2023 2:36 PM    [facility]

## 2023-08-04 ENCOUNTER — Other Ambulatory Visit: Payer: Self-pay | Admitting: Cardiovascular Disease

## 2023-08-04 DIAGNOSIS — R0602 Shortness of breath: Secondary | ICD-10-CM

## 2023-08-04 DIAGNOSIS — I10 Essential (primary) hypertension: Secondary | ICD-10-CM

## 2023-08-04 DIAGNOSIS — I340 Nonrheumatic mitral (valve) insufficiency: Secondary | ICD-10-CM

## 2023-08-04 DIAGNOSIS — R002 Palpitations: Secondary | ICD-10-CM

## 2023-08-04 DIAGNOSIS — E782 Mixed hyperlipidemia: Secondary | ICD-10-CM

## 2023-10-25 ENCOUNTER — Ambulatory Visit: Admitting: Cardiovascular Disease

## 2023-11-01 ENCOUNTER — Encounter: Payer: Self-pay | Admitting: Cardiovascular Disease

## 2023-11-01 ENCOUNTER — Ambulatory Visit (INDEPENDENT_AMBULATORY_CARE_PROVIDER_SITE_OTHER): Admitting: Cardiovascular Disease

## 2023-11-01 VITALS — BP 112/78 | HR 84 | Ht 69.0 in | Wt 186.6 lb

## 2023-11-01 DIAGNOSIS — I10 Essential (primary) hypertension: Secondary | ICD-10-CM

## 2023-11-01 DIAGNOSIS — E782 Mixed hyperlipidemia: Secondary | ICD-10-CM

## 2023-11-01 DIAGNOSIS — R002 Palpitations: Secondary | ICD-10-CM

## 2023-11-01 DIAGNOSIS — I340 Nonrheumatic mitral (valve) insufficiency: Secondary | ICD-10-CM

## 2023-11-01 DIAGNOSIS — R0602 Shortness of breath: Secondary | ICD-10-CM

## 2023-11-01 MED ORDER — METOPROLOL SUCCINATE ER 25 MG PO TB24
ORAL_TABLET | ORAL | 2 refills | Status: AC
Start: 2023-11-01 — End: ?

## 2023-11-01 NOTE — Progress Notes (Signed)
 Cardiology Office Note   Date:  11/01/2023   ID:  Jenna Durham, DOB [birth year], MRN [id number]  PCP:  Glendale Landmark, NP  Cardiologist:  Debborah Fairly, MD      History of Present Illness: Jenna Durham is a 58 y.o. female who presents for  Chief Complaint  Patient presents with   Follow-up    3 month follow up    No complaints      Past Medical History:  Diagnosis Date   Flank pain 08/21/2022   Hematuria 08/21/2022   Hypertension    Lower abdominal pain 08/21/2022   Other fatigue 07/05/2022   Right wrist pain 06/14/2022   Seasonal allergic rhinitis due to pollen 06/27/2022   URI with cough and congestion 06/27/2022     Past Surgical History:  Procedure Laterality Date   APPENDECTOMY       Current Outpatient Medications  Medication Sig Dispense Refill   diclofenac (VOLTAREN) 75 MG EC tablet Take 75 mg by mouth 2 (two) times daily.     losartan -hydrochlorothiazide (HYZAAR) 100-12.5 MG tablet TAKE 1 TABLET BY MOUTH EVERY DAY 30 tablet 2   metoprolol succinate (TOPROL XL) 25 MG 24 hr tablet Use when having palpitation 30 tablet 2   rosuvastatin (CRESTOR) 20 MG tablet TAKE 1 TABLET BY MOUTH NIGHTLY AT BEDTIME FOR HIGH CHOLESTEROL 90 tablet 3   No current facility-administered medications for this visit.    Allergies:   Amlodipine besylate and Amoxicillin-pot clavulanate    Social History:   reports that she has never smoked. She has never used smokeless tobacco. She reports that she does not currently use alcohol. No history on file for drug use.   Family History:  family history is not on file.    ROS:     Review of Systems  Constitutional: Negative.   HENT: Negative.    Eyes: Negative.   Respiratory: Negative.    Gastrointestinal: Negative.   Genitourinary: Negative.   Musculoskeletal: Negative.   Skin: Negative.   Neurological: Negative.   Endo/Heme/Allergies: Negative.   Psychiatric/Behavioral: Negative.    All other systems  reviewed and are negative.     All other systems are reviewed and negative.    PHYSICAL EXAM: VS:  BP 112/78   Pulse 84   Ht 5' 9 (1.753 m)   Wt 186 lb 9.6 oz (84.6 kg)   LMP 03/25/2015   SpO2 97%   BMI 27.56 kg/m  , BMI Body mass index is 27.56 kg/m. Last weight:  Wt Readings from Last 3 Encounters:  11/01/23 186 lb 9.6 oz (84.6 kg)  07/19/23 181 lb (82.1 kg)  03/12/23 186 lb 12.8 oz (84.7 kg)     Physical Exam Constitutional:      Appearance: Normal appearance.   Cardiovascular:     Rate and Rhythm: Normal rate and regular rhythm.     Heart sounds: Normal heart sounds.  Pulmonary:     Effort: Pulmonary effort is normal.     Breath sounds: Normal breath sounds.   Musculoskeletal:     Right lower leg: No edema.     Left lower leg: No edema.   Neurological:     Mental Status: She is alert.       EKG:   Recent Labs: 12/31/2022: ALT 20; BUN 13; Creatinine, Ser 0.79; Hemoglobin 13.0; Platelets 305; Potassium 4.3; Sodium 139; TSH 1.600    Lipid Panel    Component Value Date/Time   CHOL 129 12/31/2022 0955  TRIG 52 12/31/2022 0955   HDL 65 12/31/2022 0955   CHOLHDL 2.0 12/31/2022 0955   LDLCALC 52 12/31/2022 0955      Other studies Reviewed: Additional studies/ records that were reviewed today include:  Review of the above records demonstrates:       No data to display            ASSESSMENT AND PLAN:    ICD-10-CM   1. Nonrheumatic mitral valve regurgitation  I34.0 metoprolol succinate (TOPROL XL) 25 MG 24 hr tablet   Trace to mild    2. Mixed hyperlipidemia  E78.2 metoprolol succinate (TOPROL XL) 25 MG 24 hr tablet    3. Essential hypertension, benign  I10 metoprolol succinate (TOPROL XL) 25 MG 24 hr tablet    4. Palpitation  R00.2 metoprolol succinate (TOPROL XL) 25 MG 24 hr tablet   Occasionlally after sugar intake. Take metoprolol, when palpitation occures.    5. SOB (shortness of breath)  R06.02 metoprolol succinate (TOPROL XL)  25 MG 24 hr tablet   No longer SOB.       Problem List Items Addressed This Visit       Cardiovascular and Mediastinum   Essential hypertension, benign   Relevant Medications   metoprolol succinate (TOPROL XL) 25 MG 24 hr tablet     Other   Mixed hyperlipidemia   Relevant Medications   metoprolol succinate (TOPROL XL) 25 MG 24 hr tablet   Other Visit Diagnoses       Nonrheumatic mitral valve regurgitation    -  Primary   Trace to mild   Relevant Medications   metoprolol succinate (TOPROL XL) 25 MG 24 hr tablet     Palpitation       Occasionlally after sugar intake. Take metoprolol, when palpitation occures.   Relevant Medications   metoprolol succinate (TOPROL XL) 25 MG 24 hr tablet     SOB (shortness of breath)       No longer SOB.   Relevant Medications   metoprolol succinate (TOPROL XL) 25 MG 24 hr tablet          Disposition:   No follow-ups on file.    Total time spent: 30 minutes  Signed,  Debborah Fairly, MD  11/01/2023 3:07 PM    [facility]

## 2023-11-06 ENCOUNTER — Other Ambulatory Visit: Payer: Self-pay | Admitting: Cardiovascular Disease

## 2023-11-06 DIAGNOSIS — I340 Nonrheumatic mitral (valve) insufficiency: Secondary | ICD-10-CM

## 2023-11-06 DIAGNOSIS — E782 Mixed hyperlipidemia: Secondary | ICD-10-CM

## 2023-11-06 DIAGNOSIS — R0602 Shortness of breath: Secondary | ICD-10-CM

## 2023-11-06 DIAGNOSIS — R002 Palpitations: Secondary | ICD-10-CM

## 2023-11-06 DIAGNOSIS — I10 Essential (primary) hypertension: Secondary | ICD-10-CM

## 2023-12-08 ENCOUNTER — Other Ambulatory Visit: Payer: Self-pay | Admitting: Family

## 2023-12-08 DIAGNOSIS — E785 Hyperlipidemia, unspecified: Secondary | ICD-10-CM

## 2023-12-12 ENCOUNTER — Ambulatory Visit: Admitting: Cardiology

## 2024-01-31 ENCOUNTER — Ambulatory Visit: Admitting: Cardiovascular Disease

## 2024-02-02 ENCOUNTER — Other Ambulatory Visit: Payer: Self-pay | Admitting: Cardiovascular Disease

## 2024-02-02 DIAGNOSIS — E782 Mixed hyperlipidemia: Secondary | ICD-10-CM

## 2024-02-02 DIAGNOSIS — R002 Palpitations: Secondary | ICD-10-CM

## 2024-02-02 DIAGNOSIS — R0602 Shortness of breath: Secondary | ICD-10-CM

## 2024-02-02 DIAGNOSIS — I10 Essential (primary) hypertension: Secondary | ICD-10-CM

## 2024-02-02 DIAGNOSIS — I340 Nonrheumatic mitral (valve) insufficiency: Secondary | ICD-10-CM

## 2024-02-14 ENCOUNTER — Ambulatory Visit: Admitting: Cardiovascular Disease

## 2024-05-07 ENCOUNTER — Other Ambulatory Visit: Payer: Self-pay | Admitting: Cardiovascular Disease

## 2024-05-07 DIAGNOSIS — E782 Mixed hyperlipidemia: Secondary | ICD-10-CM

## 2024-05-07 DIAGNOSIS — R002 Palpitations: Secondary | ICD-10-CM

## 2024-05-07 DIAGNOSIS — I340 Nonrheumatic mitral (valve) insufficiency: Secondary | ICD-10-CM

## 2024-05-07 DIAGNOSIS — R0602 Shortness of breath: Secondary | ICD-10-CM

## 2024-05-07 DIAGNOSIS — I10 Essential (primary) hypertension: Secondary | ICD-10-CM
